# Patient Record
Sex: Female | Born: 1970 | Race: Black or African American | Hispanic: No | Marital: Single | Smoking: Never smoker
Health system: Southern US, Community
[De-identification: ages and names within clinical notes are randomized; demographics above are authoritative.]

## PROBLEM LIST (undated history)

## (undated) DIAGNOSIS — D869 Sarcoidosis, unspecified: Secondary | ICD-10-CM

## (undated) DIAGNOSIS — E785 Hyperlipidemia, unspecified: Secondary | ICD-10-CM

## (undated) DIAGNOSIS — I10 Essential (primary) hypertension: Secondary | ICD-10-CM

## (undated) DIAGNOSIS — D259 Leiomyoma of uterus, unspecified: Secondary | ICD-10-CM

## (undated) HISTORY — PX: OTHER SURGICAL HISTORY: SHX169

## (undated) HISTORY — DX: Essential (primary) hypertension: I10

## (undated) HISTORY — DX: Leiomyoma of uterus, unspecified: D25.9

---

## 2005-06-15 ENCOUNTER — Ambulatory Visit: Payer: Self-pay | Admitting: Family Medicine

## 2010-02-11 ENCOUNTER — Ambulatory Visit: Payer: Self-pay | Admitting: Obstetrics and Gynecology

## 2011-02-17 ENCOUNTER — Ambulatory Visit: Payer: Self-pay | Admitting: Obstetrics and Gynecology

## 2012-02-18 ENCOUNTER — Ambulatory Visit: Payer: Self-pay | Admitting: Obstetrics and Gynecology

## 2012-04-15 ENCOUNTER — Ambulatory Visit: Payer: Self-pay | Admitting: Family Medicine

## 2013-02-28 ENCOUNTER — Ambulatory Visit: Payer: Self-pay | Admitting: Obstetrics and Gynecology

## 2013-12-19 DIAGNOSIS — R87610 Atypical squamous cells of undetermined significance on cytologic smear of cervix (ASC-US): Secondary | ICD-10-CM | POA: Insufficient documentation

## 2013-12-19 LAB — HM PAP SMEAR: HM Pap smear: POSITIVE

## 2014-01-25 LAB — TSH: TSH: 0.87 u[IU]/mL (ref <=5.90)

## 2014-04-05 ENCOUNTER — Ambulatory Visit: Payer: Self-pay | Admitting: Obstetrics and Gynecology

## 2014-04-17 ENCOUNTER — Ambulatory Visit: Payer: Self-pay | Admitting: Obstetrics and Gynecology

## 2014-08-01 LAB — BASIC METABOLIC PANEL
BUN: 13 mg/dL (ref 4–21)
CREATININE: 0.9 mg/dL (ref <=1.1)
GLUCOSE: 99 mg/dL

## 2014-08-01 LAB — LIPID PANEL
Cholesterol: 270 mg/dL — AB (ref 0–200)
HDL: 79 mg/dL — AB (ref 35–70)
LDL CALC: 172 mg/dL
Triglycerides: 96 mg/dL (ref 40–160)

## 2014-11-06 DIAGNOSIS — E785 Hyperlipidemia, unspecified: Secondary | ICD-10-CM | POA: Insufficient documentation

## 2014-11-06 DIAGNOSIS — Z1331 Encounter for screening for depression: Secondary | ICD-10-CM | POA: Insufficient documentation

## 2014-11-06 DIAGNOSIS — I10 Essential (primary) hypertension: Secondary | ICD-10-CM | POA: Insufficient documentation

## 2014-11-06 DIAGNOSIS — Z9114 Patient's other noncompliance with medication regimen: Secondary | ICD-10-CM | POA: Insufficient documentation

## 2015-01-03 ENCOUNTER — Ambulatory Visit (INDEPENDENT_AMBULATORY_CARE_PROVIDER_SITE_OTHER): Payer: BC Managed Care – PPO | Admitting: Family Medicine

## 2015-01-03 ENCOUNTER — Encounter: Payer: Self-pay | Admitting: Family Medicine

## 2015-01-03 ENCOUNTER — Other Ambulatory Visit: Payer: Self-pay | Admitting: Obstetrics and Gynecology

## 2015-01-03 VITALS — BP 120/70 | HR 62 | Ht 62.0 in | Wt 154.0 lb

## 2015-01-03 DIAGNOSIS — I10 Essential (primary) hypertension: Secondary | ICD-10-CM | POA: Diagnosis not present

## 2015-01-03 DIAGNOSIS — E785 Hyperlipidemia, unspecified: Secondary | ICD-10-CM

## 2015-01-03 DIAGNOSIS — N921 Excessive and frequent menstruation with irregular cycle: Secondary | ICD-10-CM | POA: Diagnosis not present

## 2015-01-03 DIAGNOSIS — Z1231 Encounter for screening mammogram for malignant neoplasm of breast: Secondary | ICD-10-CM

## 2015-01-03 MED ORDER — HYDROCHLOROTHIAZIDE 12.5 MG PO TABS: 12.5000 mg | ORAL_TABLET | Freq: Every day | ORAL | Status: DC

## 2015-01-03 MED ORDER — AMLODIPINE BESYLATE 5 MG PO TABS: 5.0000 mg | ORAL_TABLET | Freq: Every day | ORAL | Status: DC

## 2015-01-03 NOTE — Progress Notes (Signed)
Name: Sarah Mcgrath   MRN: [id number]    DOB: [birth year]   Date:01/03/2015       Progress Note  Subjective  Chief Complaint  Chief Complaint  Patient presents with  . Hypertension    B/P recheck- needs lipid, TSH, anemia, renal    Hypertension This is a chronic problem. The current episode started more than 1 year ago. The problem has been gradually improving since onset. The problem is controlled. Associated symptoms include malaise/fatigue. Pertinent negatives include no anxiety, blurred vision, chest pain, headaches, neck pain, orthopnea, palpitations, peripheral edema, PND, shortness of breath or sweats. There are no known risk factors for coronary artery disease. The current treatment provides moderate improvement. There are no compliance problems.  There is no history of angina, kidney disease, CAD/MI, CVA, heart failure, left ventricular hypertrophy, PVD or retinopathy. There is no history of chronic renal disease.    No problem-specific assessment & plan notes found for this encounter.   Past Medical History  Diagnosis Date  . Fibroid uterus     Past Surgical History  Procedure Laterality Date  . Fibroid      History reviewed. No pertinent family history.  History   Social History  . Marital Status: Single    Spouse Name: N/A  . Number of Children: N/A  . Years of Education: N/A   Occupational History  . Not on file.   Social History Main Topics  . Smoking status: Never Smoker   . Smokeless tobacco: Not on file  . Alcohol Use: 0.0 oz/week    0 Standard drinks or equivalent per week  . Drug Use: No  . Sexual Activity: Yes   Other Topics Concern  . Not on file   Social History Narrative    No Known Allergies   Review of Systems  Constitutional: Positive for malaise/fatigue.  HENT: Negative for nosebleeds and sore throat.   Eyes: Negative for blurred vision.  Respiratory: Negative for shortness of breath.   Cardiovascular: Negative for  chest pain, palpitations, orthopnea and PND.  Gastrointestinal: Negative for heartburn, nausea and vomiting.  Musculoskeletal: Negative for neck pain.  Skin: Negative for itching and rash.  Neurological: Negative for dizziness, tingling, tremors, loss of consciousness and headaches.  Endo/Heme/Allergies: Negative for environmental allergies and polydipsia. Does not bruise/bleed easily.  Psychiatric/Behavioral: Negative for depression. The patient is not nervous/anxious.      Objective  Filed Vitals:   01/03/15 0811  BP: 120/70  Pulse: 62  Height: 5\' 2"  (1.575 m)  Weight: 154 lb (69.854 kg)    Physical Exam  Constitutional: She is well-developed, well-nourished, and in no distress. No distress.  HENT:  Head: Normocephalic and atraumatic.  Right Ear: External ear normal.  Left Ear: External ear normal.  Nose: Nose normal.  Mouth/Throat: Oropharynx is clear and moist.  Eyes: Conjunctivae and EOM are normal. Pupils are equal, round, and reactive to light. Right eye exhibits no discharge. Left eye exhibits no discharge.  Neck: Normal range of motion. Neck supple. No JVD present. No thyromegaly present.  Cardiovascular: Normal rate, regular rhythm, normal heart sounds and intact distal pulses.  Exam reveals no gallop and no friction rub.   No murmur heard. Pulmonary/Chest: Effort normal and breath sounds normal. No respiratory distress. She has no wheezes.  Abdominal: Soft. Bowel sounds are normal. She exhibits no mass. There is no tenderness. There is no guarding.  Musculoskeletal: Normal range of motion. She exhibits no edema.  Lymphadenopathy:    She  has no cervical adenopathy.  Neurological: She is alert. She has normal reflexes.  Skin: Skin is warm and dry. She is not diaphoretic.  Psychiatric: Mood and affect normal.      Assessment & Plan  Problem List Items Addressed This Visit      Cardiovascular and Mediastinum   Essential (primary) hypertension - Primary    Relevant Medications   amLODipine (NORVASC) 5 MG tablet   hydrochlorothiazide (HYDRODIURIL) 12.5 MG tablet   Other Relevant Orders   Renal Function Panel     Other   HLD (hyperlipidemia)   Relevant Medications   amLODipine (NORVASC) 5 MG tablet   hydrochlorothiazide (HYDRODIURIL) 12.5 MG tablet   Other Relevant Orders   Lipid Profile    Other Visit Diagnoses    Menorrhagia with irregular cycle        Relevant Orders    TSH    CBC w/Diff         Dr. Yarden Hillis [facility]  01/03/2015

## 2015-01-04 LAB — RENAL FUNCTION PANEL
Albumin: 4.4 g/dL (ref 3.5–5.5)
BUN/Creatinine Ratio: 20 (ref 9–23)
BUN: 17 mg/dL (ref 6–24)
CALCIUM: 10.2 mg/dL (ref 8.7–10.2)
CHLORIDE: 99 mmol/L (ref 97–108)
CO2: 25 mmol/L (ref 18–29)
CREATININE: 0.83 mg/dL (ref 0.57–1.00)
GFR calc Af Amer: 99 mL/min/{1.73_m2} (ref >59)
GFR calc non Af Amer: 86 mL/min/{1.73_m2} (ref >59)
GLUCOSE: 90 mg/dL (ref 65–99)
Phosphorus: 3.8 mg/dL (ref 2.5–4.5)
Potassium: 3.9 mmol/L (ref 3.5–5.2)
Sodium: 140 mmol/L (ref 134–144)

## 2015-01-04 LAB — CBC WITH DIFFERENTIAL/PLATELET
BASOS: 1 %
Basophils Absolute: 0 10*3/uL (ref 0.0–0.2)
EOS (ABSOLUTE): 0.1 10*3/uL (ref 0.0–0.4)
Eos: 1 %
HEMATOCRIT: 38.6 % (ref 34.0–46.6)
HEMOGLOBIN: 12.3 g/dL (ref 11.1–15.9)
IMMATURE GRANS (ABS): 0 10*3/uL (ref 0.0–0.1)
IMMATURE GRANULOCYTES: 0 %
LYMPHS ABS: 1.1 10*3/uL (ref 0.7–3.1)
LYMPHS: 19 %
MCH: 27 pg (ref 26.6–33.0)
MCHC: 31.9 g/dL (ref 31.5–35.7)
MCV: 85 fL (ref 79–97)
Monocytes Absolute: 0.7 10*3/uL (ref 0.1–0.9)
Monocytes: 12 %
NEUTROS ABS: 3.8 10*3/uL (ref 1.4–7.0)
Neutrophils: 67 %
Platelets: 312 10*3/uL (ref 150–379)
RBC: 4.56 x10E6/uL (ref 3.77–5.28)
RDW: 13.6 % (ref 12.3–15.4)
WBC: 5.7 10*3/uL (ref 3.4–10.8)

## 2015-01-04 LAB — LIPID PANEL
CHOL/HDL RATIO: 3.3 ratio (ref 0.0–4.4)
Cholesterol, Total: 265 mg/dL — ABNORMAL HIGH (ref 100–199)
HDL: 80 mg/dL (ref >39)
LDL CALC: 168 mg/dL — AB (ref 0–99)
Triglycerides: 84 mg/dL (ref 0–149)
VLDL CHOLESTEROL CAL: 17 mg/dL (ref 5–40)

## 2015-01-04 LAB — TSH: TSH: 1.31 u[IU]/mL (ref 0.450–4.500)

## 2015-04-08 ENCOUNTER — Ambulatory Visit
Admission: RE | Admit: 2015-04-08 | Discharge: 2015-04-08 | Disposition: A | Discharge status: Home / Self Care | Payer: BC Managed Care – PPO | Source: Ambulatory Visit | Attending: Obstetrics and Gynecology | Admitting: Obstetrics and Gynecology

## 2015-04-08 DIAGNOSIS — Z1231 Encounter for screening mammogram for malignant neoplasm of breast: Secondary | ICD-10-CM | POA: Diagnosis present

## 2015-09-21 ENCOUNTER — Other Ambulatory Visit: Payer: Self-pay | Admitting: Family Medicine

## 2015-10-20 ENCOUNTER — Other Ambulatory Visit: Payer: Self-pay | Admitting: Family Medicine

## 2015-11-11 ENCOUNTER — Other Ambulatory Visit: Payer: Self-pay | Admitting: Family Medicine

## 2015-11-13 ENCOUNTER — Other Ambulatory Visit: Payer: Self-pay | Admitting: Dermatology

## 2015-11-13 ENCOUNTER — Ambulatory Visit
Admission: RE | Admit: 2015-11-13 | Discharge: 2015-11-13 | Disposition: A | Discharge status: Home / Self Care | Payer: BC Managed Care – PPO | Source: Ambulatory Visit | Attending: Dermatology | Admitting: Dermatology

## 2015-11-13 DIAGNOSIS — D863 Sarcoidosis of skin: Secondary | ICD-10-CM

## 2015-11-13 DIAGNOSIS — L309 Dermatitis, unspecified: Secondary | ICD-10-CM | POA: Diagnosis not present

## 2015-11-20 ENCOUNTER — Other Ambulatory Visit: Payer: Self-pay | Admitting: Family Medicine

## 2015-12-16 ENCOUNTER — Other Ambulatory Visit: Payer: Self-pay | Admitting: Family Medicine

## 2016-03-03 ENCOUNTER — Other Ambulatory Visit: Payer: Self-pay

## 2016-03-03 MED ORDER — AMLODIPINE BESYLATE 5 MG PO TABS: 5.0000 mg | ORAL_TABLET | Freq: Every day | ORAL | 0 refills | Status: DC

## 2016-03-03 MED ORDER — HYDROCHLOROTHIAZIDE 12.5 MG PO CAPS: 12.5000 mg | ORAL_CAPSULE | Freq: Every day | ORAL | 0 refills | Status: DC

## 2016-03-11 ENCOUNTER — Other Ambulatory Visit: Payer: Self-pay | Admitting: Obstetrics and Gynecology

## 2016-03-11 DIAGNOSIS — Z1231 Encounter for screening mammogram for malignant neoplasm of breast: Secondary | ICD-10-CM

## 2016-03-17 ENCOUNTER — Encounter: Payer: Self-pay | Admitting: Family Medicine

## 2016-03-17 ENCOUNTER — Ambulatory Visit (INDEPENDENT_AMBULATORY_CARE_PROVIDER_SITE_OTHER): Payer: BC Managed Care – PPO | Admitting: Family Medicine

## 2016-03-17 VITALS — BP 138/98 | HR 80 | Ht 62.0 in | Wt 151.0 lb

## 2016-03-17 DIAGNOSIS — I10 Essential (primary) hypertension: Secondary | ICD-10-CM

## 2016-03-17 MED ORDER — HYDROCHLOROTHIAZIDE 25 MG PO TABS: 25.0000 mg | ORAL_TABLET | Freq: Every day | ORAL | 1 refills | Status: DC

## 2016-03-17 MED ORDER — AMLODIPINE BESYLATE 5 MG PO TABS: 5.0000 mg | ORAL_TABLET | Freq: Every day | ORAL | 1 refills | Status: DC

## 2016-03-17 NOTE — Progress Notes (Signed)
Name: Sarah Mcgrath   MRN: [id number]    DOB: [birth year]   Date:03/17/2016       Progress Note  Subjective  Chief Complaint  Chief Complaint  Patient presents with  . Hypertension    Hypertension  This is a chronic problem. The current episode started more than 1 year ago. The problem has been gradually improving since onset. The problem is controlled. Pertinent negatives include no anxiety, blurred vision, chest pain, headaches, malaise/fatigue, neck pain, orthopnea, palpitations, peripheral edema, PND, shortness of breath or sweats. There are no associated agents to hypertension. There are no known risk factors for coronary artery disease. Past treatments include diuretics and calcium channel blockers. The current treatment provides no improvement. There are no compliance problems.  There is no history of angina, kidney disease, CAD/MI, CVA, heart failure, left ventricular hypertrophy, PVD, renovascular disease or retinopathy. There is no history of chronic renal disease or a hypertension causing med.    No problem-specific Assessment & Plan notes found for this encounter.   Past Medical History:  Diagnosis Date  . Fibroid uterus   . Hypertension     Past Surgical History:  Procedure Laterality Date  . fibroid      Family History  Problem Relation Age of Onset  . Diabetes Mother   . Heart disease Mother   . Hypertension Father   . Stroke Maternal Grandmother   . Stroke Maternal Grandfather     Social History   Social History  . Marital status: Single    Spouse name: N/A  . Number of children: N/A  . Years of education: N/A   Occupational History  . Not on file.   Social History Main Topics  . Smoking status: Never Smoker  . Smokeless tobacco: Not on file  . Alcohol use 0.0 oz/week  . Drug use: No  . Sexual activity: Yes   Other Topics Concern  . Not on file   Social History Narrative  . No narrative on file    No Known Allergies   Review of  Systems  Constitutional: Negative for chills, fever, malaise/fatigue and weight loss.  HENT: Negative for ear discharge, ear pain and sore throat.   Eyes: Negative for blurred vision.  Respiratory: Negative for cough, sputum production, shortness of breath and wheezing.   Cardiovascular: Negative for chest pain, palpitations, orthopnea, leg swelling and PND.  Gastrointestinal: Negative for abdominal pain, blood in stool, constipation, diarrhea, heartburn, melena and nausea.  Genitourinary: Negative for dysuria, frequency, hematuria and urgency.  Musculoskeletal: Negative for back pain, joint pain, myalgias and neck pain.  Skin: Negative for rash.  Neurological: Negative for dizziness, tingling, sensory change, focal weakness and headaches.  Endo/Heme/Allergies: Negative for environmental allergies and polydipsia. Does not bruise/bleed easily.  Psychiatric/Behavioral: Negative for depression and suicidal ideas. The patient is not nervous/anxious and does not have insomnia.      Objective  Vitals:   03/17/16 0802  BP: (!) 138/98  Pulse: 80  Weight: 151 lb (68.5 kg)  Height: 5\' 2"  (1.575 m)    Physical Exam  Constitutional: She is well-developed, well-nourished, and in no distress. No distress.  HENT:  Head: Normocephalic and atraumatic.  Right Ear: Tympanic membrane, external ear and ear canal normal.  Left Ear: Tympanic membrane, external ear and ear canal normal.  Nose: Nose normal.  Mouth/Throat: Oropharynx is clear and moist.  Eyes: Conjunctivae and EOM are normal. Pupils are equal, round, and reactive to light. Right eye exhibits no discharge.  Left eye exhibits no discharge.  Neck: Normal range of motion. Neck supple. No JVD present. No thyromegaly present.  Cardiovascular: Normal rate, regular rhythm, normal heart sounds and intact distal pulses.  Exam reveals no gallop and no friction rub.   No murmur heard. Pulmonary/Chest: Effort normal and breath sounds normal. She has  no wheezes. She has no rales.  Abdominal: Soft. Bowel sounds are normal. She exhibits no mass. There is no tenderness. There is no guarding.  Musculoskeletal: Normal range of motion. She exhibits no edema.  Lymphadenopathy:    She has no cervical adenopathy.  Neurological: She is alert.  Skin: Skin is warm and dry. She is not diaphoretic.  Psychiatric: Mood and affect normal.  Nursing note and vitals reviewed.     Assessment & Plan  Problem List Items Addressed This Visit      Cardiovascular and Mediastinum   Essential (primary) hypertension   Relevant Medications   amLODipine (NORVASC) 5 MG tablet   hydrochlorothiazide (HYDRODIURIL) 25 MG tablet    Other Visit Diagnoses    Essential hypertension    -  Primary   Relevant Medications   amLODipine (NORVASC) 5 MG tablet   hydrochlorothiazide (HYDRODIURIL) 25 MG tablet        Dr. Deanna Jones [facility]  03/17/16

## 2016-04-09 ENCOUNTER — Ambulatory Visit
Admission: RE | Admit: 2016-04-09 | Discharge: 2016-04-09 | Disposition: A | Discharge status: Home / Self Care | Payer: BC Managed Care – PPO | Source: Ambulatory Visit | Attending: Obstetrics and Gynecology | Admitting: Obstetrics and Gynecology

## 2016-04-09 DIAGNOSIS — Z1231 Encounter for screening mammogram for malignant neoplasm of breast: Secondary | ICD-10-CM | POA: Insufficient documentation

## 2016-04-28 ENCOUNTER — Ambulatory Visit (INDEPENDENT_AMBULATORY_CARE_PROVIDER_SITE_OTHER): Payer: BC Managed Care – PPO | Admitting: Family Medicine

## 2016-04-28 ENCOUNTER — Encounter: Payer: Self-pay | Admitting: Family Medicine

## 2016-04-28 DIAGNOSIS — I10 Essential (primary) hypertension: Secondary | ICD-10-CM | POA: Diagnosis not present

## 2016-04-28 MED ORDER — AMLODIPINE BESYLATE 5 MG PO TABS: 5.0000 mg | ORAL_TABLET | Freq: Every day | ORAL | 1 refills | Status: DC

## 2016-04-28 MED ORDER — HYDROCHLOROTHIAZIDE 25 MG PO TABS: 25.0000 mg | ORAL_TABLET | Freq: Every day | ORAL | 1 refills | Status: DC

## 2016-04-28 NOTE — Progress Notes (Signed)
Name: Sarah Mcgrath   MRN: [id number]    DOB: 12-30-1970   Date:04/28/2016       Progress Note  Subjective  Chief Complaint  Chief Complaint  Patient presents with  . Hypertension    Hypertension  This is a chronic problem. The current episode started more than 1 year ago. The problem has been gradually improving since onset. The problem is controlled. Pertinent negatives include no anxiety, blurred vision, chest pain, headaches, malaise/fatigue, neck pain, orthopnea, palpitations, peripheral edema, PND, shortness of breath or sweats. There are no associated agents to hypertension. There are no known risk factors for coronary artery disease. Past treatments include calcium channel blockers and diuretics. The current treatment provides moderate improvement. There are no compliance problems.  There is no history of angina, kidney disease, CAD/MI, CVA, heart failure, left ventricular hypertrophy, PVD, renovascular disease or retinopathy. There is no history of chronic renal disease or a hypertension causing med.    No problem-specific Assessment & Plan notes found for this encounter.   Past Medical History:  Diagnosis Date  . Fibroid uterus   . Hypertension     Past Surgical History:  Procedure Laterality Date  . fibroid      Family History  Problem Relation Age of Onset  . Diabetes Mother   . Heart disease Mother   . Hypertension Father   . Stroke Maternal Grandmother   . Stroke Maternal Grandfather     Social History   Social History  . Marital status: Single    Spouse name: N/A  . Number of children: N/A  . Years of education: N/A   Occupational History  . Not on file.   Social History Main Topics  . Smoking status: Never Smoker  . Smokeless tobacco: Not on file  . Alcohol use 0.0 oz/week  . Drug use: No  . Sexual activity: Yes   Other Topics Concern  . Not on file   Social History Narrative  . No narrative on file    No Known  Allergies   Review of Systems  Constitutional: Negative for chills, fever, malaise/fatigue and weight loss.  HENT: Negative for ear discharge, ear pain and sore throat.   Eyes: Negative for blurred vision.  Respiratory: Negative for cough, sputum production, shortness of breath and wheezing.   Cardiovascular: Negative for chest pain, palpitations, orthopnea, leg swelling and PND.  Gastrointestinal: Negative for abdominal pain, blood in stool, constipation, diarrhea, heartburn, melena and nausea.  Genitourinary: Negative for dysuria, frequency, hematuria and urgency.  Musculoskeletal: Negative for back pain, joint pain, myalgias and neck pain.  Skin: Negative for rash.  Neurological: Negative for dizziness, tingling, sensory change, focal weakness and headaches.  Endo/Heme/Allergies: Negative for environmental allergies and polydipsia. Does not bruise/bleed easily.  Psychiatric/Behavioral: Negative for depression and suicidal ideas. The patient is not nervous/anxious and does not have insomnia.      Objective  Vitals:   04/28/16 0807  BP: 128/80  Pulse: 68  Weight: 154 lb (69.9 kg)  Height: 5\' 2"  (1.575 m)    Physical Exam  Constitutional: She is well-developed, well-nourished, and in no distress. No distress.  HENT:  Head: Normocephalic and atraumatic.  Right Ear: External ear normal.  Left Ear: External ear normal.  Nose: Nose normal.  Mouth/Throat: Oropharynx is clear and moist.  Eyes: Conjunctivae and EOM are normal. Pupils are equal, round, and reactive to light. Right eye exhibits no discharge. Left eye exhibits no discharge.  Neck: Normal range of motion.  Neck supple. No JVD present. No thyromegaly present.  Cardiovascular: Normal rate, regular rhythm, normal heart sounds and intact distal pulses.  Exam reveals no gallop and no friction rub.   No murmur heard. Pulmonary/Chest: Effort normal and breath sounds normal. She has no wheezes. She has no rales.  Abdominal:  Soft. Bowel sounds are normal. She exhibits no mass. There is no tenderness. There is no guarding.  Musculoskeletal: Normal range of motion. She exhibits no edema.  Lymphadenopathy:    She has no cervical adenopathy.  Neurological: She is alert. She has normal reflexes.  Skin: Skin is warm and dry. She is not diaphoretic.  Psychiatric: Mood and affect normal.  Nursing note and vitals reviewed.     Assessment & Plan  Problem List Items Addressed This Visit      Cardiovascular and Mediastinum   Essential hypertension   Relevant Medications   amLODipine (NORVASC) 5 MG tablet   hydrochlorothiazide (HYDRODIURIL) 25 MG tablet   Other Relevant Orders   Renal Function Panel    Other Visit Diagnoses   None.    I spent 15 minutes with this patient, More than 50% of that time was spent in face to face education, counseling and care coordination.   Dr. [facility]  04/28/16

## 2016-04-29 LAB — RENAL FUNCTION PANEL
Albumin: 4.4 g/dL (ref 3.5–5.5)
BUN / CREAT RATIO: 20 (ref 9–23)
BUN: 16 mg/dL (ref 6–24)
CO2: 25 mmol/L (ref 18–29)
CREATININE: 0.8 mg/dL (ref 0.57–1.00)
Calcium: 9.6 mg/dL (ref 8.7–10.2)
Chloride: 97 mmol/L (ref 96–106)
GFR, EST AFRICAN AMERICAN: 102 mL/min/{1.73_m2} (ref >59)
GFR, EST NON AFRICAN AMERICAN: 89 mL/min/{1.73_m2} (ref >59)
GLUCOSE: 80 mg/dL (ref 65–99)
PHOSPHORUS: 3 mg/dL (ref 2.5–4.5)
POTASSIUM: 3.8 mmol/L (ref 3.5–5.2)
SODIUM: 138 mmol/L (ref 134–144)

## 2016-05-07 ENCOUNTER — Other Ambulatory Visit: Payer: Self-pay

## 2016-10-26 ENCOUNTER — Ambulatory Visit: Payer: BC Managed Care – PPO | Admitting: Family Medicine

## 2016-10-29 ENCOUNTER — Encounter: Payer: Self-pay | Admitting: Family Medicine

## 2016-10-29 ENCOUNTER — Ambulatory Visit (INDEPENDENT_AMBULATORY_CARE_PROVIDER_SITE_OTHER): Payer: BC Managed Care – PPO | Admitting: Family Medicine

## 2016-10-29 VITALS — BP 120/70 | HR 68 | Ht 62.0 in | Wt 149.0 lb

## 2016-10-29 DIAGNOSIS — Z23 Encounter for immunization: Secondary | ICD-10-CM | POA: Diagnosis not present

## 2016-10-29 DIAGNOSIS — I10 Essential (primary) hypertension: Secondary | ICD-10-CM | POA: Diagnosis not present

## 2016-10-29 MED ORDER — AMLODIPINE BESYLATE 5 MG PO TABS: 5.0000 mg | ORAL_TABLET | Freq: Every day | ORAL | 3 refills | Status: DC

## 2016-10-29 MED ORDER — HYDROCHLOROTHIAZIDE 25 MG PO TABS: 25.0000 mg | ORAL_TABLET | Freq: Every day | ORAL | 3 refills | Status: DC

## 2016-10-29 NOTE — Progress Notes (Signed)
Name: Sarah Mcgrath   MRN: [id number]    DOB: 09-25-1970   Date:10/29/2016       Progress Note  Subjective  Chief Complaint  Chief Complaint  Patient presents with  . Hypertension    Hypertension  This is a chronic problem. The current episode started more than 1 year ago. The problem has been gradually improving since onset. The problem is controlled. Pertinent negatives include no anxiety, blurred vision, chest pain, headaches, malaise/fatigue, neck pain, orthopnea, palpitations, peripheral edema, PND, shortness of breath or sweats. There are no associated agents to hypertension. Risk factors for coronary artery disease include dyslipidemia and diabetes mellitus. Past treatments include calcium channel blockers and diuretics. The current treatment provides moderate improvement. There are no compliance problems.  There is no history of angina, kidney disease, CAD/MI, CVA, heart failure, left ventricular hypertrophy, PVD or retinopathy. There is no history of chronic renal disease, a hypertension causing med or renovascular disease.    No problem-specific Assessment & Plan notes found for this encounter.   Past Medical History:  Diagnosis Date  . Fibroid uterus   . Hypertension     Past Surgical History:  Procedure Laterality Date  . fibroid      Family History  Problem Relation Age of Onset  . Diabetes Mother   . Heart disease Mother   . Hypertension Father   . Stroke Maternal Grandmother   . Stroke Maternal Grandfather     Social History   Social History  . Marital status: Single    Spouse name: N/A  . Number of children: N/A  . Years of education: N/A   Occupational History  . Not on file.   Social History Main Topics  . Smoking status: Never Smoker  . Smokeless tobacco: Not on file  . Alcohol use 0.0 oz/week  . Drug use: No  . Sexual activity: Yes   Other Topics Concern  . Not on file   Social History Narrative  . No narrative on file    No  Known Allergies  Outpatient Medications Prior to Visit  Medication Sig Dispense Refill  . amLODipine (NORVASC) 5 MG tablet Take 1 tablet (5 mg total) by mouth daily. 90 tablet 1  . hydrochlorothiazide (HYDRODIURIL) 25 MG tablet Take 1 tablet (25 mg total) by mouth daily. 90 tablet 1  . fluocinonide cream (LIDEX) 5.99 % Apply 1 application topically daily.     No facility-administered medications prior to visit.     Review of Systems  Constitutional: Negative for chills, fever, malaise/fatigue and weight loss.  HENT: Negative for ear discharge, ear pain and sore throat.   Eyes: Negative for blurred vision.  Respiratory: Negative for cough, sputum production, shortness of breath and wheezing.   Cardiovascular: Negative for chest pain, palpitations, orthopnea, leg swelling and PND.  Gastrointestinal: Negative for abdominal pain, blood in stool, constipation, diarrhea, heartburn, melena and nausea.  Genitourinary: Negative for dysuria, frequency, hematuria and urgency.  Musculoskeletal: Negative for back pain, joint pain, myalgias and neck pain.  Skin: Negative for rash.  Neurological: Negative for dizziness, tingling, sensory change, focal weakness and headaches.  Endo/Heme/Allergies: Negative for environmental allergies and polydipsia. Does not bruise/bleed easily.  Psychiatric/Behavioral: Negative for depression and suicidal ideas. The patient is not nervous/anxious and does not have insomnia.      Objective  Vitals:   10/29/16 0810  BP: 120/70  Pulse: 68  Weight: 149 lb (67.6 kg)  Height: 5\' 2"  (1.575 m)    Physical  Exam  Constitutional: She is well-developed, well-nourished, and in no distress. No distress.  HENT:  Head: Normocephalic and atraumatic.  Right Ear: External ear normal.  Left Ear: External ear normal.  Nose: Nose normal.  Mouth/Throat: Oropharynx is clear and moist.  Eyes: Conjunctivae and EOM are normal. Pupils are equal, round, and reactive to light. Right  eye exhibits no discharge. Left eye exhibits no discharge.  Neck: Normal range of motion. Neck supple. No JVD present. No thyromegaly present.  Cardiovascular: Normal rate, regular rhythm, normal heart sounds and intact distal pulses.  Exam reveals no gallop and no friction rub.   No murmur heard. Pulmonary/Chest: Effort normal and breath sounds normal. She has no wheezes. She has no rales.  Abdominal: Soft. Bowel sounds are normal. She exhibits no mass. There is no tenderness. There is no rebound and no guarding.  Musculoskeletal: Normal range of motion. She exhibits no edema.  Lymphadenopathy:    She has no cervical adenopathy.  Neurological: She is alert. She has normal reflexes.  Skin: Skin is warm and dry. She is not diaphoretic.  keloid  Psychiatric: Mood and affect normal.      Assessment & Plan  Problem List Items Addressed This Visit      Cardiovascular and Mediastinum   Essential hypertension - Primary   Relevant Medications   hydrochlorothiazide (HYDRODIURIL) 25 MG tablet   amLODipine (NORVASC) 5 MG tablet    Other Visit Diagnoses    Need for diphtheria-tetanus-pertussis (Tdap) vaccine       Relevant Orders   Tdap vaccine greater than or equal to 7yo IM (Completed)      Meds ordered this encounter  Medications  . hydrochlorothiazide (HYDRODIURIL) 25 MG tablet    Sig: Take 1 tablet (25 mg total) by mouth daily.    Dispense:  90 tablet    Refill:  3  . amLODipine (NORVASC) 5 MG tablet    Sig: Take 1 tablet (5 mg total) by mouth daily.    Dispense:  90 tablet    Refill:  3    Last time filling- sched appt      Dr. Otilio Miu [facility]  10/29/16

## 2017-03-03 ENCOUNTER — Other Ambulatory Visit: Payer: Self-pay | Admitting: Family Medicine

## 2017-03-03 DIAGNOSIS — Z1231 Encounter for screening mammogram for malignant neoplasm of breast: Secondary | ICD-10-CM

## 2017-03-04 ENCOUNTER — Ambulatory Visit
Admission: RE | Admit: 2017-03-04 | Discharge: 2017-03-04 | Disposition: A | Discharge status: Home / Self Care | Payer: BC Managed Care – PPO | Source: Ambulatory Visit | Attending: Family Medicine | Admitting: Family Medicine

## 2017-03-04 ENCOUNTER — Encounter: Payer: Self-pay | Admitting: Family Medicine

## 2017-03-04 ENCOUNTER — Ambulatory Visit (INDEPENDENT_AMBULATORY_CARE_PROVIDER_SITE_OTHER): Payer: BC Managed Care – PPO | Admitting: Family Medicine

## 2017-03-04 VITALS — BP 120/78 | HR 80 | Ht 62.0 in | Wt 150.0 lb

## 2017-03-04 DIAGNOSIS — D869 Sarcoidosis, unspecified: Secondary | ICD-10-CM

## 2017-03-04 NOTE — Patient Instructions (Signed)
Sarcoidosis Sarcoidosis is a disease that causes inflammation in your organs and other areas of your body. The lungs are most often affected (pulmonary sarcoidosis). Sarcoidosis can also affect your lymph nodes, liver, eyes, skin, or any other body tissue. When you have sarcoidosis, small clumps of tissue (granulomas) form in the affected area of your body. Granulomas are made up of your body's defense (immune) cells. Inflammation results when your body reacts to a harmful substance. Normally, inflammation goes away after immune cells get rid of the harmful substance. In sarcoidosis, the immune cells form granulomas instead. What are the causes? The exact cause of sarcoidosis is not known. Something triggers the immune system to respond, such as dust, chemicals, bacteria, or a virus. What increases the risk? You may be at a greater risk for sarcoidosis if you:  Have a family history of the disease.  Are African American.  Are of Northern European ancestry.  Are 20-50 years old.  Are female.  What are the signs or symptoms? Many people with sarcoidosis have no symptoms. Others have very mild symptoms. Sarcoidosis most often affects the lungs. Symptoms include:  Chest pain.  Coughing.  Wheezing.  Shortness of breath.  Other common symptoms include:  Night sweats.  Weight loss.  Fatigue.  Depression.  A sense of uneasiness.  How is this diagnosed? Sarcoidosis may be diagnosed by:  Medical history and physical exam.  Chest X-ray. This looks for granulomas in your lungs.  Lung function tests. These measure your breathing and look for problems related to sarcoidosis.  Examining a sample of tissue under a microscope (biopsy).  How is this treated? Sarcoidosis usually clears up without treatment. You may take medicines to reduce inflammation or relieve symptoms. These may include:  Prednisone. This steroid reduces inflammation related to sarcoidosis.  Chloroquine or  hydroxychloroquine. These are antimalarial medicines used to treat sarcoidosis that affects the skin or brain.  Methotrexate, leflunomide, or azathioprine. These medicines affect the immune system and can help with sarcoidosis in the joints, eyes, skin, or lungs.  Inhalers. Inhaled medicines can help you breathe if sarcoidosis is affecting your lungs.  Follow these instructions at home:  Do not use any tobacco products, including cigarettes, chewing tobacco, or electronic cigarettes. If you need help quitting, ask your health care provider.  Avoid secondhand smoke.  Avoid irritating dust and chemicals. Stay indoors on days when air quality is poor in your area.  Take medicines only as directed by your health care provider. Contact a health care provider if:  You have vision problems.  You have shortness of breath.  You have a dry, persistent cough.  You have an irregular heartbeat.  You have pain or ache in your joints, hands, or feet.  You have an unexplained rash. Get help right away if: You have chest pain. This information is not intended to replace advice given to you by your health care provider. Make sure you discuss any questions you have with your health care provider. Document Released: 04/15/2004 Document Revised: 11/21/2015 Document Reviewed: 10/11/2013 Elsevier Interactive Patient Education  2018 [employer]  

## 2017-03-04 NOTE — Progress Notes (Signed)
Name: Sarah Mcgrath   MRN: [id number]    DOB: 1971-04-25   Date:03/04/2017       Progress Note  Subjective  Chief Complaint  Chief Complaint  Patient presents with  . 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      No problem-specific Assessment & Plan notes found for this encounter.   Past Medical History:  Diagnosis Date  . Fibroid uterus   . Hypertension     Past Surgical History:  Procedure Laterality Date  . fibroid      Family History  Problem Relation Age of Onset  . Diabetes Mother   . Heart disease Mother   . Hypertension Father   . Stroke Maternal Grandmother   . Stroke Maternal Grandfather     Social History   Social History  . Marital status: Single    Spouse name: N/A  . Number of children: N/A  . Years of education: N/A   Occupational History  . Not on file.   Social History Main Topics  . Smoking status: Never Smoker  . Smokeless tobacco: Never Used  . Alcohol use 0.0 oz/week  . Drug use: No  . Sexual activity: Yes   Other Topics Concern  . Not on file   Social History Narrative  . No narrative on file    No Known Allergies  Outpatient Medications Prior to Visit  Medication Sig Dispense Refill  . amLODipine (NORVASC) 5 MG tablet Take 1 tablet (5 mg total) by mouth daily. 90 tablet 3  . hydrochlorothiazide (HYDRODIURIL) 25 MG tablet Take 1 tablet (25 mg total) by mouth daily. 90 tablet 3   No facility-administered medications prior to visit.     Review of Systems  Constitutional: Negative for chills, fever, malaise/fatigue and weight loss.  HENT: Negative for ear discharge, ear pain and sore throat.   Eyes: Negative for blurred vision.  Respiratory: Negative for cough, sputum production, shortness of breath and wheezing.   Cardiovascular: Negative for chest pain, palpitations and leg swelling.  Gastrointestinal: Negative for abdominal pain, blood in stool,  constipation, diarrhea, heartburn, melena and nausea.  Genitourinary: Negative for dysuria, frequency, hematuria and urgency.  Musculoskeletal: Negative for back pain, joint pain, myalgias and neck pain.  Skin: Negative for rash.  Neurological: Negative for dizziness, tingling, sensory change, focal weakness and headaches.  Endo/Heme/Allergies: Negative for environmental allergies and polydipsia. Does not bruise/bleed easily.  Psychiatric/Behavioral: Negative for depression and suicidal ideas. The patient is not nervous/anxious and does not have insomnia.      Objective  Vitals:   03/04/17 1612  BP: 120/78  Pulse: 80  Weight: 150 lb (68 kg)  Height: 5\' 2"  (1.575 m)    Physical Exam  Constitutional: She is well-developed, well-nourished, and in no distress. No distress.  HENT:  Head: Normocephalic and atraumatic.  Right Ear: External ear normal.  Left Ear: External ear normal.  Nose: Nose normal.  Mouth/Throat: Oropharynx is clear and moist.  Eyes: Pupils are equal, round, and reactive to light. Conjunctivae and EOM are normal. Right eye exhibits no discharge. Left eye exhibits no discharge.  Neck: Normal range of motion. Neck supple. No JVD present. No thyromegaly present.  Cardiovascular: Normal rate, regular rhythm, normal heart sounds and intact distal pulses.  Exam reveals no gallop and no friction rub.   No murmur heard. Pulmonary/Chest: Effort normal and breath sounds normal. She has no wheezes. She has  no rales.  Abdominal: Soft. Bowel sounds are normal. She exhibits no mass. There is no tenderness. There is no guarding.  Musculoskeletal: Normal range of motion. She exhibits no edema.  Lymphadenopathy:    She has no cervical adenopathy.  Neurological: She is alert. She has normal reflexes.  Skin: Skin is warm and dry. Rash noted. Rash is maculopapular. She is not diaphoretic.  Psychiatric: Mood and affect normal.  Nursing note and vitals reviewed.     Assessment &  Plan  Problem List Items Addressed This Visit    None    Visit Diagnoses    Sarcoid    -  Primary   Relevant Orders   CBC w/Diff/Platelet   Uric acid   Hepatic function panel   DG Chest 2 View   Comprehensive Metabolic Panel (CMET)      No orders of the defined types were placed in this encounter.     Dr. Macon [facility]  03/04/17

## 2017-03-05 LAB — COMPREHENSIVE METABOLIC PANEL
A/G RATIO: 1.7 (ref 1.2–2.2)
ALT: 29 IU/L (ref 0–32)
AST: 29 IU/L (ref 0–40)
Albumin: 4.8 g/dL (ref 3.5–5.5)
Alkaline Phosphatase: 73 IU/L (ref 39–117)
BUN / CREAT RATIO: 13 (ref 9–23)
BUN: 9 mg/dL (ref 6–24)
Bilirubin Total: 0.5 mg/dL (ref 0.0–1.2)
CO2: 26 mmol/L (ref 20–29)
CREATININE: 0.72 mg/dL (ref 0.57–1.00)
Calcium: 10.2 mg/dL (ref 8.7–10.2)
Chloride: 96 mmol/L (ref 96–106)
GFR calc Af Amer: 115 mL/min/{1.73_m2} (ref >59)
GFR, EST NON AFRICAN AMERICAN: 100 mL/min/{1.73_m2} (ref >59)
GLUCOSE: 82 mg/dL (ref 65–99)
Globulin, Total: 2.9 g/dL (ref 1.5–4.5)
Potassium: 3.2 mmol/L — ABNORMAL LOW (ref 3.5–5.2)
SODIUM: 138 mmol/L (ref 134–144)
Total Protein: 7.7 g/dL (ref 6.0–8.5)

## 2017-03-05 LAB — CBC WITH DIFFERENTIAL/PLATELET
Basophils Absolute: 0 10*3/uL (ref 0.0–0.2)
Basos: 0 %
EOS (ABSOLUTE): 0.2 10*3/uL (ref 0.0–0.4)
Eos: 3 %
HEMATOCRIT: 37.6 % (ref 34.0–46.6)
Hemoglobin: 12.8 g/dL (ref 11.1–15.9)
IMMATURE GRANULOCYTES: 0 %
Immature Grans (Abs): 0 10*3/uL (ref 0.0–0.1)
LYMPHS ABS: 1.6 10*3/uL (ref 0.7–3.1)
Lymphs: 28 %
MCH: 27.2 pg (ref 26.6–33.0)
MCHC: 34 g/dL (ref 31.5–35.7)
MCV: 80 fL (ref 79–97)
MONOS ABS: 0.9 10*3/uL (ref 0.1–0.9)
Monocytes: 16 %
NEUTROS PCT: 53 %
Neutrophils Absolute: 3 10*3/uL (ref 1.4–7.0)
PLATELETS: 339 10*3/uL (ref 150–379)
RBC: 4.71 x10E6/uL (ref 3.77–5.28)
RDW: 13.7 % (ref 12.3–15.4)
WBC: 5.6 10*3/uL (ref 3.4–10.8)

## 2017-03-05 LAB — HEPATIC FUNCTION PANEL: BILIRUBIN, DIRECT: 0.1 mg/dL (ref 0.00–0.40)

## 2017-03-05 LAB — URIC ACID: Uric Acid: 6.1 mg/dL (ref 2.5–7.1)

## 2017-04-16 ENCOUNTER — Encounter: Payer: Self-pay | Admitting: Family Medicine

## 2017-04-16 ENCOUNTER — Ambulatory Visit (INDEPENDENT_AMBULATORY_CARE_PROVIDER_SITE_OTHER): Payer: BC Managed Care – PPO | Admitting: Family Medicine

## 2017-04-16 VITALS — BP 130/80 | HR 76 | Ht 62.0 in | Wt 150.0 lb

## 2017-04-16 DIAGNOSIS — E7800 Pure hypercholesterolemia, unspecified: Secondary | ICD-10-CM | POA: Diagnosis not present

## 2017-04-16 DIAGNOSIS — I10 Essential (primary) hypertension: Secondary | ICD-10-CM | POA: Diagnosis not present

## 2017-04-16 MED ORDER — AMLODIPINE BESYLATE 5 MG PO TABS: 5.0000 mg | ORAL_TABLET | Freq: Every day | ORAL | 1 refills | Status: DC

## 2017-04-16 MED ORDER — HYDROCHLOROTHIAZIDE 25 MG PO TABS: 25.0000 mg | ORAL_TABLET | Freq: Every day | ORAL | 1 refills | Status: DC

## 2017-04-16 NOTE — Progress Notes (Signed)
Name: Sarah Mcgrath   MRN: [id number]    DOB: 04-Jun-1971   Date:04/16/2017       Progress Note  Subjective  Chief Complaint  Chief Complaint  Patient presents with  . Hypertension    Hypertension  This is a chronic problem. The current episode started more than 1 year ago. The problem has been gradually improving since onset. The problem is controlled. Pertinent negatives include no anxiety, blurred vision, chest pain, headaches, malaise/fatigue, neck pain, orthopnea, palpitations, peripheral edema, PND, shortness of breath or sweats. There are no associated agents to hypertension. Past treatments include diuretics and calcium channel blockers. The current treatment provides moderate improvement. There are no compliance problems.  There is no history of angina, kidney disease, CAD/MI, CVA, heart failure, left ventricular hypertrophy, PVD or retinopathy. There is no history of chronic renal disease, a hypertension causing med or renovascular disease.    No problem-specific Assessment & Plan notes found for this encounter.   Past Medical History:  Diagnosis Date  . Fibroid uterus   . Hypertension     Past Surgical History:  Procedure Laterality Date  . fibroid      Family History  Problem Relation Age of Onset  . Diabetes Mother   . Heart disease Mother   . Hypertension Father   . Stroke Maternal Grandmother   . Stroke Maternal Grandfather     Social History   Social History  . Marital status: Single    Spouse name: N/A  . Number of children: N/A  . Years of education: N/A   Occupational History  . Not on file.   Social History Main Topics  . Smoking status: Never Smoker  . Smokeless tobacco: Never Used  . Alcohol use 0.0 oz/week  . Drug use: No  . Sexual activity: Yes   Other Topics Concern  . Not on file   Social History Narrative  . No narrative on file    No Known Allergies  Outpatient Medications Prior to Visit  Medication Sig Dispense  Refill  . amLODipine (NORVASC) 5 MG tablet Take 1 tablet (5 mg total) by mouth daily. 90 tablet 3  . hydrochlorothiazide (HYDRODIURIL) 25 MG tablet Take 1 tablet (25 mg total) by mouth daily. 90 tablet 3   No facility-administered medications prior to visit.     Review of Systems  Constitutional: Negative for chills, fever, malaise/fatigue and weight loss.  HENT: Negative for ear discharge, ear pain and sore throat.   Eyes: Negative for blurred vision.  Respiratory: Negative for cough, sputum production, shortness of breath and wheezing.   Cardiovascular: Negative for chest pain, palpitations, orthopnea, leg swelling and PND.  Gastrointestinal: Negative for abdominal pain, blood in stool, constipation, diarrhea, heartburn, melena and nausea.  Genitourinary: Negative for dysuria, frequency, hematuria and urgency.  Musculoskeletal: Negative for back pain, joint pain, myalgias and neck pain.  Skin: Negative for rash.  Neurological: Negative for dizziness, tingling, sensory change, focal weakness and headaches.  Endo/Heme/Allergies: Negative for environmental allergies and polydipsia. Does not bruise/bleed easily.  Psychiatric/Behavioral: Negative for depression and suicidal ideas. The patient is not nervous/anxious and does not have insomnia.      Objective  Vitals:   04/16/17 0817  BP: 130/80  Pulse: 76  Weight: 150 lb (68 kg)  Height: 5\' 2"  (1.575 m)    Physical Exam  Constitutional: She is well-developed, well-nourished, and in no distress. No distress.  HENT:  Head: Normocephalic and atraumatic.  Right Ear: External ear normal.  Left Ear: External ear normal.  Nose: Nose normal.  Mouth/Throat: Oropharynx is clear and moist.  Eyes: Pupils are equal, round, and reactive to light. Conjunctivae and EOM are normal. Right eye exhibits no discharge. Left eye exhibits no discharge.  Neck: Normal range of motion. Neck supple. No JVD present. No thyromegaly present.  Cardiovascular:  Normal rate, regular rhythm, normal heart sounds and intact distal pulses.  Exam reveals no gallop and no friction rub.   No murmur heard. Pulmonary/Chest: Effort normal and breath sounds normal. She has no wheezes. She has no rales.  Abdominal: Soft. Bowel sounds are normal. She exhibits no mass. There is no tenderness. There is no guarding.  Musculoskeletal: Normal range of motion. She exhibits no edema.  Lymphadenopathy:    She has no cervical adenopathy.  Neurological: She is alert. She has normal reflexes.  Skin: Skin is warm and dry. She is not diaphoretic.  Psychiatric: Mood and affect normal.      Assessment & Plan  Problem List Items Addressed This Visit      Cardiovascular and Mediastinum   Essential hypertension   Relevant Medications   hydrochlorothiazide (HYDRODIURIL) 25 MG tablet   amLODipine (NORVASC) 5 MG tablet   Other Relevant Orders   Renal Function Panel     Other   HLD (hyperlipidemia) - Primary   Relevant Medications   hydrochlorothiazide (HYDRODIURIL) 25 MG tablet   amLODipine (NORVASC) 5 MG tablet   Other Relevant Orders   Lipid Profile      Meds ordered this encounter  Medications  . hydrochlorothiazide (HYDRODIURIL) 25 MG tablet    Sig: Take 1 tablet (25 mg total) by mouth daily.    Dispense:  90 tablet    Refill:  1  . amLODipine (NORVASC) 5 MG tablet    Sig: Take 1 tablet (5 mg total) by mouth daily.    Dispense:  90 tablet    Refill:  1    Last time filling- sched appt      Dr. Otilio Miu [facility]  04/16/17

## 2017-04-16 NOTE — Patient Instructions (Signed)
Sarcoidosis Sarcoidosis is a disease that causes inflammation in your organs and other areas of your body. The lungs are most often affected (pulmonary sarcoidosis). Sarcoidosis can also affect your lymph nodes, liver, eyes, skin, or any other body tissue. When you have sarcoidosis, small clumps of tissue (granulomas) form in the affected area of your body. Granulomas are made up of your body's defense (immune) cells. Inflammation results when your body reacts to a harmful substance. Normally, inflammation goes away after immune cells get rid of the harmful substance. In sarcoidosis, the immune cells form granulomas instead. What are the causes? The exact cause of sarcoidosis is not known. Something triggers the immune system to respond, such as dust, chemicals, bacteria, or a virus. What increases the risk? You may be at a greater risk for sarcoidosis if you:  Have a family history of the disease.  Are African American.  Are of Northern European ancestry.  Are 20-50 years old.  Are female.  What are the signs or symptoms? Many people with sarcoidosis have no symptoms. Others have very mild symptoms. Sarcoidosis most often affects the lungs. Symptoms include:  Chest pain.  Coughing.  Wheezing.  Shortness of breath.  Other common symptoms include:  Night sweats.  Weight loss.  Fatigue.  Depression.  A sense of uneasiness.  How is this diagnosed? Sarcoidosis may be diagnosed by:  Medical history and physical exam.  Chest X-ray. This looks for granulomas in your lungs.  Lung function tests. These measure your breathing and look for problems related to sarcoidosis.  Examining a sample of tissue under a microscope (biopsy).  How is this treated? Sarcoidosis usually clears up without treatment. You may take medicines to reduce inflammation or relieve symptoms. These may include:  Prednisone. This steroid reduces inflammation related to sarcoidosis.  Chloroquine or  hydroxychloroquine. These are antimalarial medicines used to treat sarcoidosis that affects the skin or brain.  Methotrexate, leflunomide, or azathioprine. These medicines affect the immune system and can help with sarcoidosis in the joints, eyes, skin, or lungs.  Inhalers. Inhaled medicines can help you breathe if sarcoidosis is affecting your lungs.  Follow these instructions at home:  Do not use any tobacco products, including cigarettes, chewing tobacco, or electronic cigarettes. If you need help quitting, ask your health care provider.  Avoid secondhand smoke.  Avoid irritating dust and chemicals. Stay indoors on days when air quality is poor in your area.  Take medicines only as directed by your health care provider. Contact a health care provider if:  You have vision problems.  You have shortness of breath.  You have a dry, persistent cough.  You have an irregular heartbeat.  You have pain or ache in your joints, hands, or feet.  You have an unexplained rash. Get help right away if: You have chest pain. This information is not intended to replace advice given to you by your health care provider. Make sure you discuss any questions you have with your health care provider. Document Released: 04/15/2004 Document Revised: 11/21/2015 Document Reviewed: 10/11/2013 Elsevier Interactive Patient Education  2018 [employer]  

## 2017-04-17 LAB — RENAL FUNCTION PANEL
Albumin: 4.4 g/dL (ref 3.5–5.5)
BUN/Creatinine Ratio: 17 (ref 9–23)
BUN: 12 mg/dL (ref 6–24)
CO2: 24 mmol/L (ref 20–29)
CREATININE: 0.72 mg/dL (ref 0.57–1.00)
Calcium: 10.1 mg/dL (ref 8.7–10.2)
Chloride: 99 mmol/L (ref 96–106)
GFR calc Af Amer: 115 mL/min/{1.73_m2} (ref >59)
GFR calc non Af Amer: 100 mL/min/{1.73_m2} (ref >59)
GLUCOSE: 74 mg/dL (ref 65–99)
Phosphorus: 3.5 mg/dL (ref 2.5–4.5)
Potassium: 3.6 mmol/L (ref 3.5–5.2)
Sodium: 139 mmol/L (ref 134–144)

## 2017-04-17 LAB — LIPID PANEL
CHOL/HDL RATIO: 3 ratio (ref 0.0–4.4)
Cholesterol, Total: 252 mg/dL — ABNORMAL HIGH (ref 100–199)
HDL: 83 mg/dL (ref >39)
LDL CALC: 153 mg/dL — AB (ref 0–99)
TRIGLYCERIDES: 78 mg/dL (ref 0–149)
VLDL Cholesterol Cal: 16 mg/dL (ref 5–40)

## 2017-04-20 ENCOUNTER — Ambulatory Visit
Admission: RE | Admit: 2017-04-20 | Discharge: 2017-04-20 | Disposition: A | Discharge status: Home / Self Care | Payer: BC Managed Care – PPO | Source: Ambulatory Visit | Attending: Family Medicine | Admitting: Family Medicine

## 2017-04-20 DIAGNOSIS — Z1231 Encounter for screening mammogram for malignant neoplasm of breast: Secondary | ICD-10-CM | POA: Insufficient documentation

## 2017-05-03 ENCOUNTER — Ambulatory Visit: Payer: BC Managed Care – PPO | Admitting: Family Medicine

## 2017-05-31 ENCOUNTER — Other Ambulatory Visit: Payer: Self-pay

## 2017-06-16 ENCOUNTER — Other Ambulatory Visit: Payer: Self-pay

## 2017-08-27 ENCOUNTER — Other Ambulatory Visit: Payer: Self-pay

## 2017-10-15 ENCOUNTER — Ambulatory Visit: Payer: BC Managed Care – PPO | Admitting: Family Medicine

## 2017-10-15 ENCOUNTER — Encounter: Payer: Self-pay | Admitting: Family Medicine

## 2017-10-15 VITALS — BP 120/88 | HR 72 | Ht 62.0 in | Wt 149.0 lb

## 2017-10-15 DIAGNOSIS — I10 Essential (primary) hypertension: Secondary | ICD-10-CM | POA: Diagnosis not present

## 2017-10-15 MED ORDER — AMLODIPINE BESYLATE 5 MG PO TABS: 5.0000 mg | ORAL_TABLET | Freq: Every day | ORAL | 2 refills | Status: DC

## 2017-10-15 MED ORDER — HYDROCHLOROTHIAZIDE 25 MG PO TABS: 25.0000 mg | ORAL_TABLET | Freq: Every day | ORAL | 2 refills | Status: DC

## 2017-10-15 NOTE — Progress Notes (Signed)
Name: Sarah Mcgrath   MRN: [id number]    DOB: [birth year]   Date:10/15/2017       Progress Note  Subjective  Chief Complaint  Chief Complaint  Patient presents with  . Hypertension    Hypertension  This is a chronic problem. The current episode started more than 1 year ago. The problem has been waxing and waning since onset. The problem is controlled. Pertinent negatives include no anxiety, blurred vision, chest pain, headaches, malaise/fatigue, neck pain, palpitations, peripheral edema, shortness of breath or sweats. There are no associated agents to hypertension. There are no known risk factors for coronary artery disease. Past treatments include calcium channel blockers and diuretics. The current treatment provides moderate improvement. There are no compliance problems.  There is no history of angina, kidney disease, CAD/MI, CVA, left ventricular hypertrophy, PVD or retinopathy. There is no history of chronic renal disease, a hypertension causing med or renovascular disease.    No problem-specific Assessment & Plan notes found for this encounter.   Past Medical History:  Diagnosis Date  . Fibroid uterus   . Hypertension     Past Surgical History:  Procedure Laterality Date  . fibroid      Family History  Problem Relation Age of Onset  . Diabetes Mother   . Heart disease Mother   . Hypertension Father   . Stroke Maternal Grandmother   . Stroke Maternal Grandfather   . Breast cancer Neg Hx     Social History   Socioeconomic History  . Marital status: Single    Spouse name: Not on file  . Number of children: Not on file  . Years of education: Not on file  . Highest education level: Not on file  Occupational History  . Not on file  Social Needs  . Financial resource strain: Not on file  . Food insecurity:    Worry: Not on file    Inability: Not on file  . Transportation needs:    Medical: Not on file    Non-medical: Not on file  Tobacco Use  . Smoking  status: Never Smoker  . Smokeless tobacco: Never Used  Substance and Sexual Activity  . Alcohol use: Yes    Alcohol/week: 0.0 oz  . Drug use: No  . Sexual activity: Yes  Lifestyle  . Physical activity:    Days per week: Not on file    Minutes per session: Not on file  . Stress: Not on file  Relationships  . Social connections:    Talks on phone: Not on file    Gets together: Not on file    Attends religious service: Not on file    Active member of club or organization: Not on file    Attends meetings of clubs or organizations: Not on file    Relationship status: Not on file  . Intimate partner violence:    Fear of current or ex partner: Not on file    Emotionally abused: Not on file    Physically abused: Not on file    Forced sexual activity: Not on file  Other Topics Concern  . Not on file  Social History Narrative  . Not on file    No Known Allergies  Outpatient Medications Prior to Visit  Medication Sig Dispense Refill  . amLODipine (NORVASC) 5 MG tablet Take 1 tablet (5 mg total) by mouth daily. 90 tablet 1  . hydrochlorothiazide (HYDRODIURIL) 25 MG tablet Take 1 tablet (25 mg total) by mouth daily.  90 tablet 1   No facility-administered medications prior to visit.     Review of Systems  Constitutional: Negative for chills, fever, malaise/fatigue and weight loss.  HENT: Negative for ear discharge, ear pain and sore throat.   Eyes: Negative for blurred vision.  Respiratory: Negative for cough, sputum production, shortness of breath and wheezing.   Cardiovascular: Negative for chest pain, palpitations and leg swelling.  Gastrointestinal: Negative for abdominal pain, blood in stool, constipation, diarrhea, heartburn, melena and nausea.  Genitourinary: Negative for dysuria, frequency, hematuria and urgency.  Musculoskeletal: Negative for back pain, joint pain, myalgias and neck pain.  Skin: Negative for rash.  Neurological: Negative for dizziness, tingling, sensory  change, focal weakness and headaches.  Endo/Heme/Allergies: Negative for environmental allergies and polydipsia. Does not bruise/bleed easily.  Psychiatric/Behavioral: Negative for depression and suicidal ideas. The patient is not nervous/anxious and does not have insomnia.      Objective  Vitals:   10/15/17 0910  BP: 120/88  Pulse: 72  Weight: 149 lb (67.6 kg)  Height: 5\' 2"  (1.575 m)    Physical Exam  Constitutional: She is oriented to person, place, and time. She appears well-developed and well-nourished.  HENT:  Head: Normocephalic.  Right Ear: External ear normal.  Left Ear: External ear normal.  Mouth/Throat: Oropharynx is clear and moist.  Eyes: Pupils are equal, round, and reactive to light. Conjunctivae and EOM are normal. Lids are everted and swept, no foreign bodies found. Left eye exhibits no hordeolum. No foreign body present in the left eye. Right conjunctiva is not injected. Left conjunctiva is not injected. No scleral icterus.  Neck: Normal range of motion. Neck supple. No JVD present. No tracheal deviation present. No thyromegaly present.  Cardiovascular: Normal rate, regular rhythm, normal heart sounds and intact distal pulses. Exam reveals no gallop and no friction rub.  No murmur heard. Pulmonary/Chest: Effort normal and breath sounds normal. No respiratory distress. She has no wheezes. She has no rales.  Abdominal: Soft. Bowel sounds are normal. She exhibits no mass. There is no hepatosplenomegaly. There is no tenderness. There is no rebound and no guarding.  Musculoskeletal: Normal range of motion. She exhibits no edema or tenderness.  Lymphadenopathy:    She has no cervical adenopathy.  Neurological: She is alert and oriented to person, place, and time. She has normal strength. She displays normal reflexes. No cranial nerve deficit.  Skin: Skin is warm. No rash noted.  Psychiatric: She has a normal mood and affect. Her mood appears not anxious. She does not  exhibit a depressed mood.  Nursing note and vitals reviewed.     Assessment & Plan  Problem List Items Addressed This Visit      Cardiovascular and Mediastinum   Essential hypertension - Primary   Relevant Medications   amLODipine (NORVASC) 5 MG tablet   hydrochlorothiazide (HYDRODIURIL) 25 MG tablet      Meds ordered this encounter  Medications  . amLODipine (NORVASC) 5 MG tablet    Sig: Take 1 tablet (5 mg total) by mouth daily.    Dispense:  90 tablet    Refill:  2    Last time filling- sched appt  . hydrochlorothiazide (HYDRODIURIL) 25 MG tablet    Sig: Take 1 tablet (25 mg total) by mouth daily.    Dispense:  90 tablet    Refill:  2      Dr. Otilio Miu [facility]  10/15/17

## 2018-03-22 ENCOUNTER — Other Ambulatory Visit: Payer: Self-pay | Admitting: Family Medicine

## 2018-03-22 DIAGNOSIS — Z1231 Encounter for screening mammogram for malignant neoplasm of breast: Secondary | ICD-10-CM

## 2018-04-25 ENCOUNTER — Ambulatory Visit
Admission: RE | Admit: 2018-04-25 | Discharge: 2018-04-25 | Disposition: A | Discharge status: Home / Self Care | Payer: BC Managed Care – PPO | Source: Ambulatory Visit | Attending: Family Medicine | Admitting: Family Medicine

## 2018-04-25 DIAGNOSIS — Z1231 Encounter for screening mammogram for malignant neoplasm of breast: Secondary | ICD-10-CM | POA: Diagnosis present

## 2018-06-16 ENCOUNTER — Ambulatory Visit: Payer: BC Managed Care – PPO | Admitting: Family Medicine

## 2018-06-17 ENCOUNTER — Encounter: Payer: Self-pay | Admitting: Family Medicine

## 2018-06-17 ENCOUNTER — Ambulatory Visit
Admission: RE | Admit: 2018-06-17 | Discharge: 2018-06-17 | Disposition: A | Discharge status: Home / Self Care | Payer: BC Managed Care – PPO | Source: Ambulatory Visit | Attending: Family Medicine | Admitting: Family Medicine

## 2018-06-17 ENCOUNTER — Ambulatory Visit: Payer: BC Managed Care – PPO | Admitting: Family Medicine

## 2018-06-17 ENCOUNTER — Ambulatory Visit
Admission: RE | Admit: 2018-06-17 | Discharge: 2018-06-17 | Disposition: A | Discharge status: Home / Self Care | Payer: BC Managed Care – PPO | Attending: Family Medicine | Admitting: Family Medicine

## 2018-06-17 VITALS — BP 120/70 | HR 60 | Ht 62.0 in | Wt 153.0 lb

## 2018-06-17 DIAGNOSIS — D869 Sarcoidosis, unspecified: Secondary | ICD-10-CM | POA: Diagnosis present

## 2018-06-17 DIAGNOSIS — I10 Essential (primary) hypertension: Secondary | ICD-10-CM

## 2018-06-17 MED ORDER — AMLODIPINE BESYLATE 5 MG PO TABS: 5.0000 mg | ORAL_TABLET | Freq: Every day | ORAL | 2 refills | Status: DC

## 2018-06-17 MED ORDER — HYDROCHLOROTHIAZIDE 25 MG PO TABS: 25.0000 mg | ORAL_TABLET | Freq: Every day | ORAL | 2 refills | Status: DC

## 2018-06-17 NOTE — Progress Notes (Signed)
Date:  06/17/2018   Name:  Sarah Mcgrath   DOB:  27-Mar-1971   MRN:  [id number]   Chief Complaint: Hypertension ([facility])  Hypertension  This is a chronic problem. The current episode started more than 1 year ago. The problem is unchanged. The problem is controlled. Pertinent negatives include no anxiety, blurred vision, chest pain, headaches, malaise/fatigue, neck pain, orthopnea, palpitations, peripheral edema, PND, shortness of breath or sweats. There are no associated agents to hypertension. There are no known risk factors for coronary artery disease. Past treatments include calcium channel blockers and diuretics. The current treatment provides moderate improvement. There are no compliance problems.  There is no history of angina, kidney disease, CAD/MI, CVA, heart failure, left ventricular hypertrophy, PVD or retinopathy. There is no history of chronic renal disease, a hypertension causing med or renovascular disease.    Review of Systems  Constitutional: Negative.  Negative for chills, fatigue, fever, malaise/fatigue and unexpected weight change.  HENT: Negative for congestion, ear discharge, ear pain, rhinorrhea, sinus pressure, sneezing and sore throat.   Eyes: Negative for blurred vision, photophobia, pain, discharge, redness and itching.  Respiratory: Negative for cough, shortness of breath, wheezing and stridor.   Cardiovascular: Negative for chest pain, palpitations, orthopnea and PND.  Gastrointestinal: Negative for abdominal pain, blood in stool, constipation, diarrhea, nausea and vomiting.  Endocrine: Negative for cold intolerance, heat intolerance, polydipsia, polyphagia and polyuria.  Genitourinary: Negative for dysuria, flank pain, frequency, hematuria and urgency.  Musculoskeletal: Negative for arthralgias, back pain, myalgias and neck pain.  Skin: Negative for rash.  Allergic/Immunologic: Negative for environmental allergies and food allergies.    Neurological: Negative for dizziness, weakness, light-headedness, numbness and headaches.  Hematological: Negative for adenopathy. Does not bruise/bleed easily.  Psychiatric/Behavioral: Negative for dysphoric mood. The patient is not nervous/anxious.     Patient Active Problem List   Diagnosis Date Noted  . Essential hypertension 11/06/2014  . HLD (hyperlipidemia) 11/06/2014  . Drug noncompliance 11/06/2014  . Screening for depression 11/06/2014    No Known Allergies  Past Surgical History:  Procedure Laterality Date  . fibroid      Social History   Tobacco Use  . Smoking status: Never Smoker  . Smokeless tobacco: Never Used  Substance Use Topics  . Alcohol use: Yes    Alcohol/week: 0.0 standard drinks  . Drug use: No     Medication list has been reviewed and updated.  Current Meds  Medication Sig  . amLODipine (NORVASC) 5 MG tablet Take 1 tablet (5 mg total) by mouth daily.  . hydrochlorothiazide (HYDRODIURIL) 25 MG tablet Take 1 tablet (25 mg total) by mouth daily.    PHQ 2/9 Scores 06/17/2018 10/15/2017  PHQ - 2 Score 0 0  PHQ- 9 Score 0 0    Physical Exam HENT:     Head: Normocephalic.     Right Ear: External ear normal.     Left Ear: External ear normal.     Nose: Nose normal.  Eyes:     General: No scleral icterus.       Right eye: No discharge.        Left eye: No discharge.     Conjunctiva/sclera: Conjunctivae normal.     Pupils: Pupils are equal, round, and reactive to light.  Neck:     Musculoskeletal: Normal range of motion and neck supple.     Thyroid: No thyromegaly.     Vascular: No JVD.     Trachea: No tracheal  deviation.  Cardiovascular:     Rate and Rhythm: Normal rate and regular rhythm.     Heart sounds: Normal heart sounds. No murmur. No friction rub. No gallop.   Pulmonary:     Effort: No respiratory distress.     Breath sounds: Normal breath sounds. No wheezing or rales.  Abdominal:     General: Bowel sounds are normal.      Palpations: Abdomen is soft. There is no mass.     Tenderness: There is no abdominal tenderness. There is no guarding or rebound.  Musculoskeletal: Normal range of motion.        General: No tenderness.  Lymphadenopathy:     Cervical: No cervical adenopathy.  Skin:    General: Skin is warm.     Findings: No rash.  Neurological:     Mental Status: He is alert and oriented to person, place, and time.     Cranial Nerves: No cranial nerve deficit.     Deep Tendon Reflexes: Reflexes are normal and symmetric.     BP 120/70   Pulse 60   Ht 5\' 2"  (1.575 m)   Wt 153 lb (69.4 kg)   BMI 27.98 kg/m   Assessment and Plan: 1. Essential hypertension Chronic. Controlled. Stable on meds- refill amlodipine and HCTZ/ draw renal panel - amLODipine (NORVASC) 5 MG tablet; Take 1 tablet (5 mg total) by mouth daily.  Dispense: 90 tablet; Refill: 2 - hydrochlorothiazide (HYDRODIURIL) 25 MG tablet; Take 1 tablet (25 mg total) by mouth daily.  Dispense: 90 tablet; Refill: 2 - Renal Function Panel  2. Sarcoid Order chest xray - DG Chest 2 View; Future

## 2018-06-18 LAB — RENAL FUNCTION PANEL
Albumin: 4.5 g/dL (ref 3.5–5.5)
BUN/Creatinine Ratio: 21 (ref 9–23)
BUN: 15 mg/dL (ref 6–24)
CO2: 25 mmol/L (ref 20–29)
Calcium: 10.1 mg/dL (ref 8.7–10.2)
Chloride: 102 mmol/L (ref 96–106)
Creatinine, Ser: 0.72 mg/dL (ref 0.57–1.00)
GFR calc Af Amer: 115 mL/min/{1.73_m2} (ref >59)
GFR calc non Af Amer: 99 mL/min/{1.73_m2} (ref >59)
Glucose: 91 mg/dL (ref 65–99)
Phosphorus: 3.3 mg/dL (ref 2.5–4.5)
Potassium: 3.7 mmol/L (ref 3.5–5.2)
Sodium: 142 mmol/L (ref 134–144)

## 2018-08-05 IMAGING — CR DG CHEST 2V
2 series · 2 of 2 positions shown · non-contrast
Comparison: November 13, 2015

CLINICAL DATA: History of sarcoid.  Follow-up.

EXAM:
CHEST  2 VIEW

[chest pa]
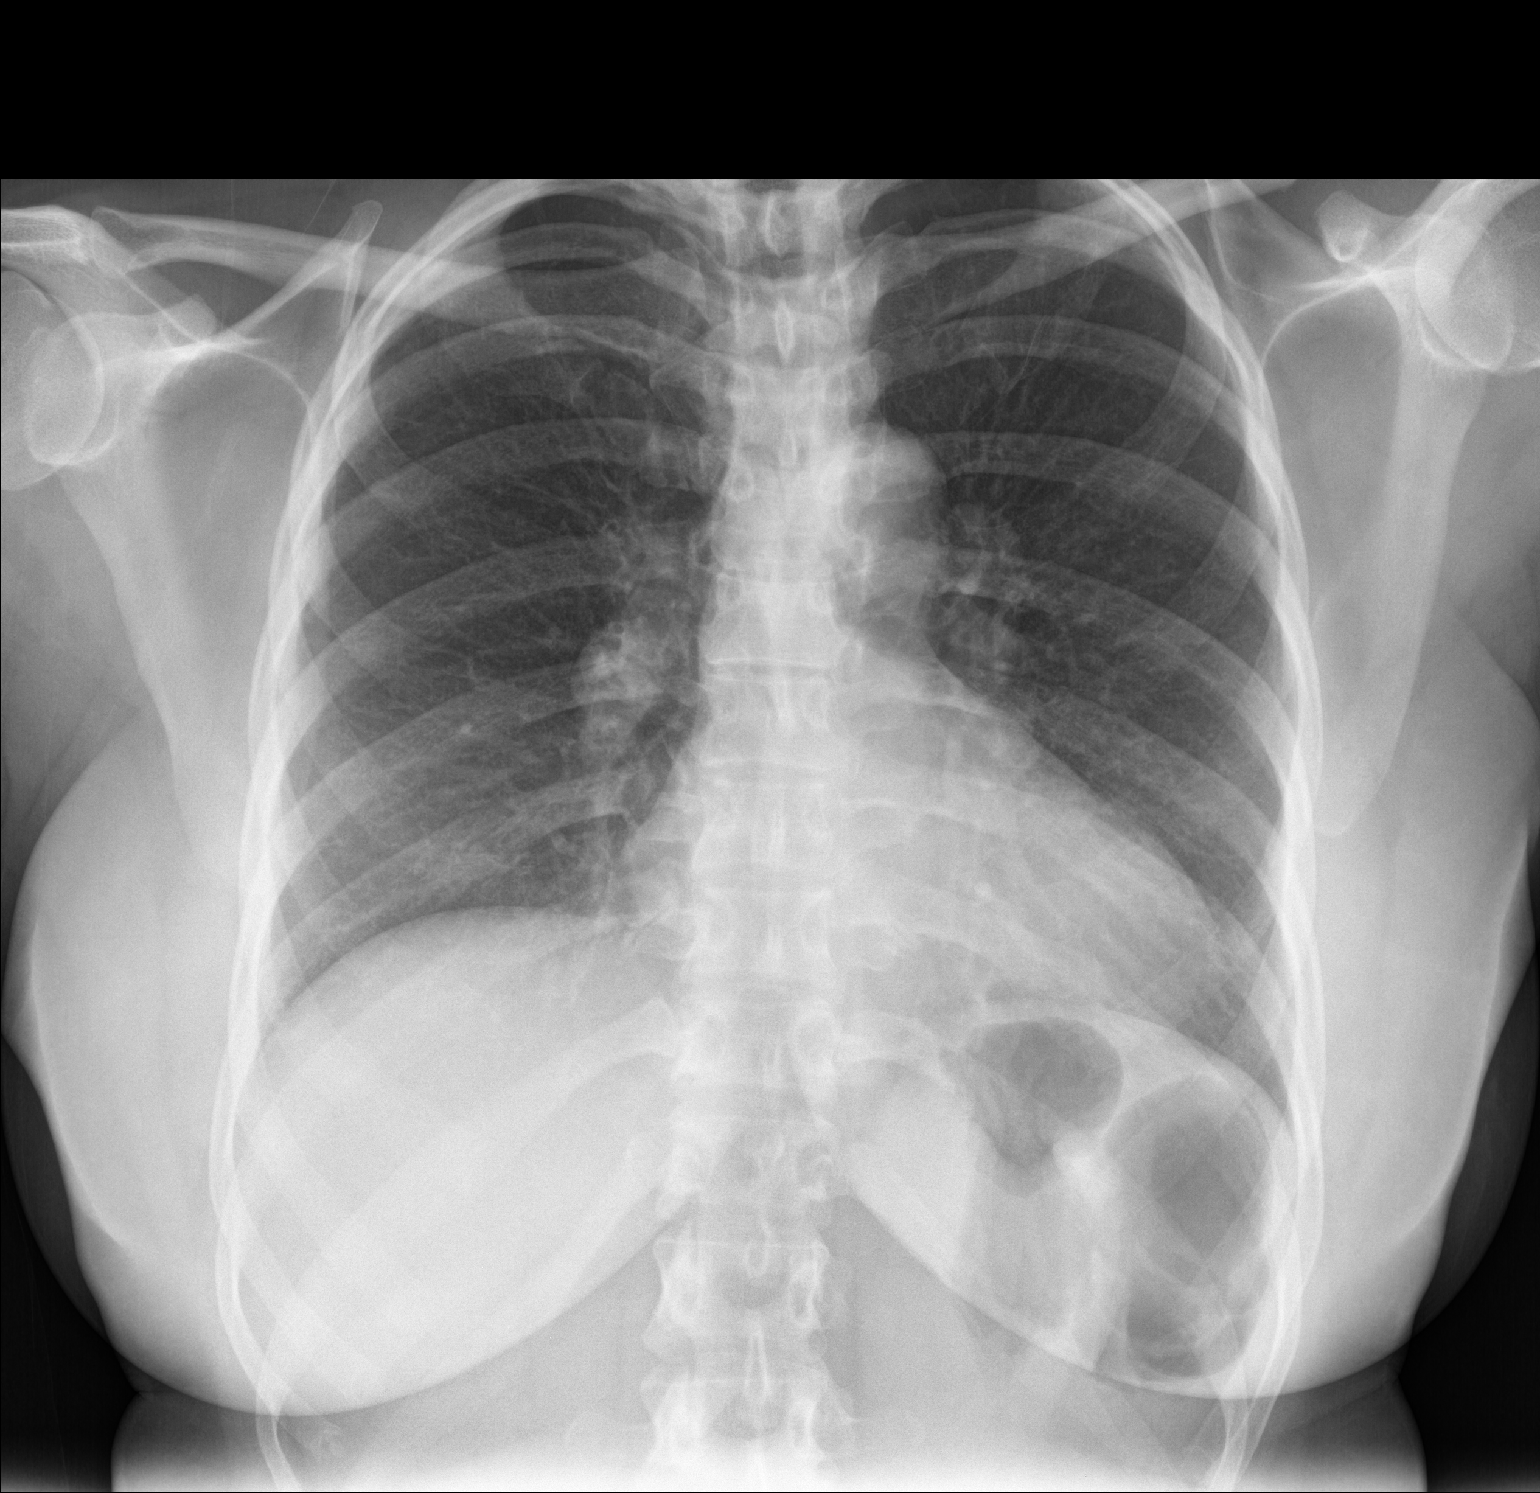

[chest lat]
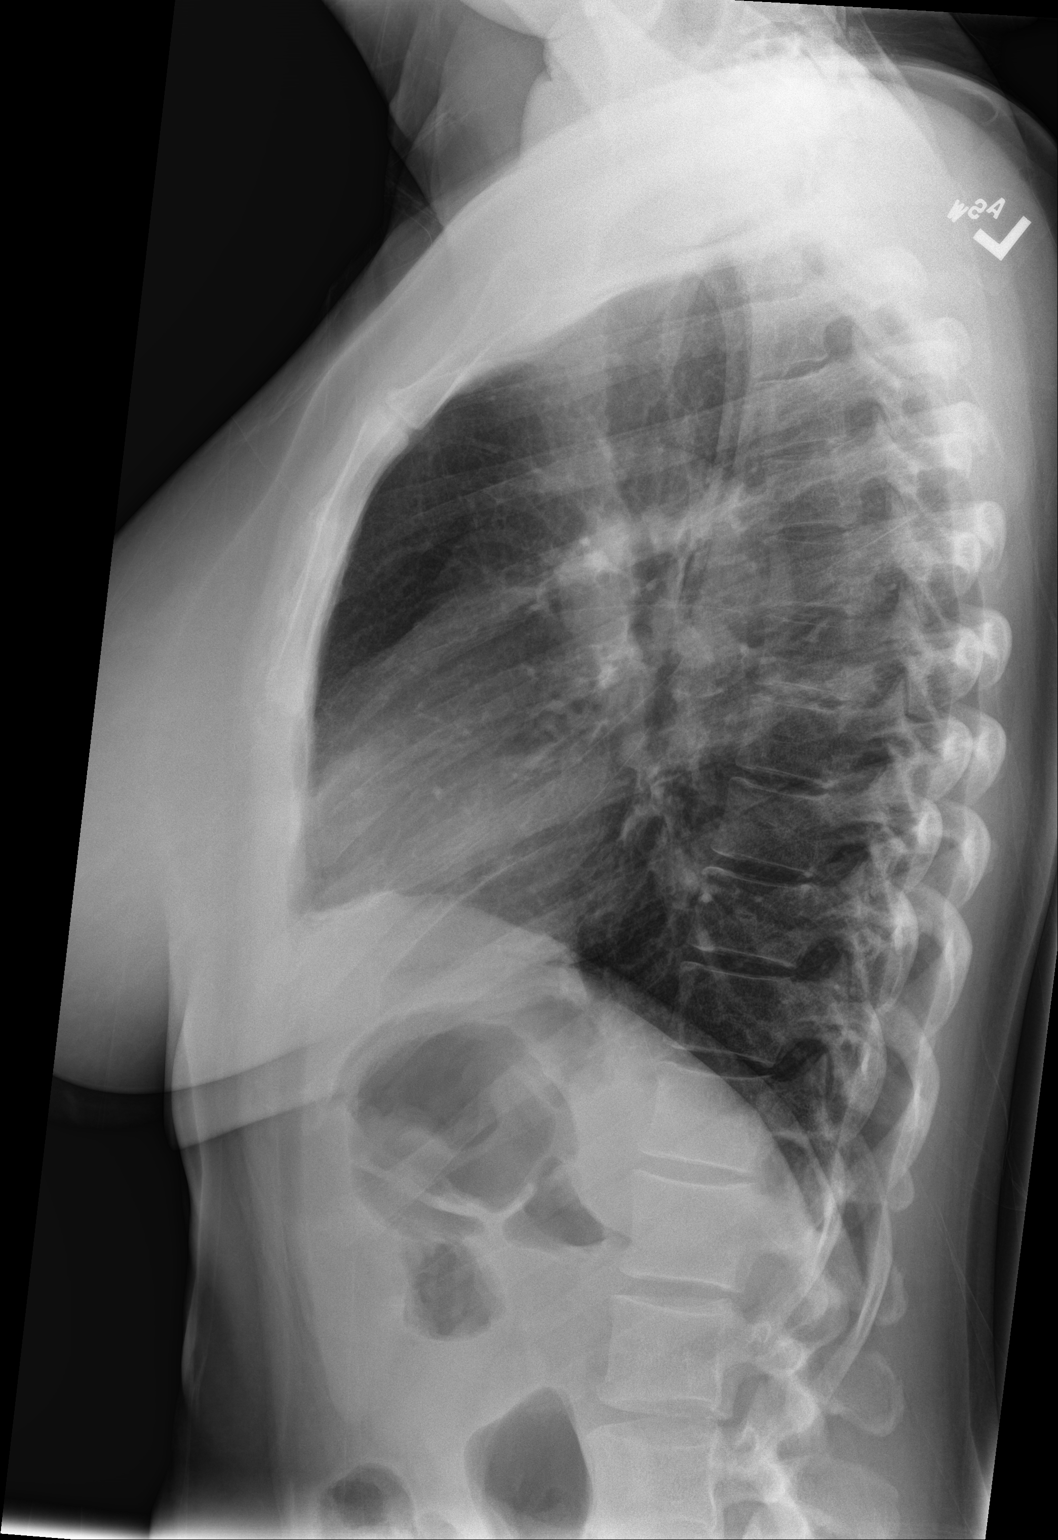

[2 of 2 positions shown; findings below may reference images not displayed]

FINDINGS: The heart and mediastinum are normal. The hilar symmetric and stable
since October 2015. It would be difficult to completely exclude
prominent nodes in the hila. No pneumothorax. No nodules or masses.
No focal infiltrates.
IMPRESSION: No interval change since October 2015. The hila are symmetric and
stable. It would be difficult to exclude prominent nodes in the hila
but there has been no change. No evidence of mediastinal adenopathy
or pulmonary pathology.

## 2018-12-20 ENCOUNTER — Ambulatory Visit: Payer: BC Managed Care – PPO | Admitting: Family Medicine

## 2018-12-20 ENCOUNTER — Encounter: Payer: Self-pay | Admitting: Family Medicine

## 2018-12-20 ENCOUNTER — Other Ambulatory Visit: Payer: Self-pay

## 2018-12-20 VITALS — BP 130/70 | HR 78 | Ht 62.0 in | Wt 155.0 lb

## 2018-12-20 DIAGNOSIS — E7801 Familial hypercholesterolemia: Secondary | ICD-10-CM | POA: Diagnosis not present

## 2018-12-20 DIAGNOSIS — I10 Essential (primary) hypertension: Secondary | ICD-10-CM

## 2018-12-20 MED ORDER — HYDROCHLOROTHIAZIDE 25 MG PO TABS: 25.0000 mg | ORAL_TABLET | Freq: Every day | ORAL | 1 refills | Status: DC

## 2018-12-20 MED ORDER — AMLODIPINE BESYLATE 5 MG PO TABS: 5.0000 mg | ORAL_TABLET | Freq: Every day | ORAL | 1 refills | Status: DC

## 2018-12-20 NOTE — Progress Notes (Signed)
Date:  12/20/2018   Name:  Sarah Mcgrath   DOB:  1970/08/20   MRN:  [id number]   Chief Complaint: Hypertension (good on meds- wants lipids checked)  Hypertension This is a chronic problem. The current episode started more than 1 year ago. The problem has been gradually improving since onset. The problem is controlled. Pertinent negatives include no anxiety, blurred vision, chest pain, headaches, malaise/fatigue, neck pain, orthopnea, palpitations, peripheral edema, PND, shortness of breath or sweats. There are no associated agents to hypertension. There are no known risk factors for coronary artery disease. Past treatments include calcium channel blockers and diuretics. The current treatment provides moderate improvement. There are no compliance problems.  There is no history of angina, kidney disease, CAD/MI, CVA, heart failure, left ventricular hypertrophy, PVD or retinopathy. There is no history of chronic renal disease, a hypertension causing med or renovascular disease.    Review of Systems  Constitutional: Negative.  Negative for chills, fatigue, fever, malaise/fatigue and unexpected weight change.  HENT: Negative for congestion, ear discharge, ear pain, rhinorrhea, sinus pressure, sneezing and sore throat.   Eyes: Negative for blurred vision, photophobia, pain, discharge, redness and itching.  Respiratory: Negative for cough, shortness of breath, wheezing and stridor.   Cardiovascular: Negative for chest pain, palpitations, orthopnea and PND.  Gastrointestinal: Negative for abdominal pain, blood in stool, constipation, diarrhea, nausea and vomiting.  Endocrine: Negative for cold intolerance, heat intolerance, polydipsia, polyphagia and polyuria.  Genitourinary: Negative for dysuria, flank pain, frequency, hematuria and urgency.  Musculoskeletal: Negative for arthralgias, back pain, myalgias and neck pain.  Skin: Negative for rash.  Allergic/Immunologic: Negative for  environmental allergies and food allergies.  Neurological: Negative for dizziness, weakness, light-headedness, numbness and headaches.  Hematological: Negative for adenopathy. Does not bruise/bleed easily.  Psychiatric/Behavioral: Negative for dysphoric mood. The patient is not nervous/anxious.     Patient Active Problem List   Diagnosis Date Noted  . Essential hypertension 11/06/2014  . HLD (hyperlipidemia) 11/06/2014  . Drug noncompliance 11/06/2014  . Screening for depression 11/06/2014    No Known Allergies  Past Surgical History:  Procedure Laterality Date  . fibroid      Social History   Tobacco Use  . Smoking status: Never Smoker  . Smokeless tobacco: Never Used  Substance Use Topics  . Alcohol use: Yes    Alcohol/week: 0.0 standard drinks  . Drug use: No     Medication list has been reviewed and updated.  Current Meds  Medication Sig  . amLODipine (NORVASC) 5 MG tablet Take 1 tablet (5 mg total) by mouth daily.  . hydrochlorothiazide (HYDRODIURIL) 25 MG tablet Take 1 tablet (25 mg total) by mouth daily.    PHQ 2/9 Scores 12/20/2018 06/17/2018 10/15/2017  PHQ - 2 Score 0 0 0  PHQ- 9 Score 0 0 0    BP Readings from Last 3 Encounters:  12/20/18 130/70  06/17/18 120/70  10/15/17 120/88    Physical Exam Vitals signs and nursing note reviewed.  Constitutional:      General: He is not in acute distress.    Appearance: He is not diaphoretic.  HENT:     Head: Normocephalic and atraumatic.     Right Ear: Tympanic membrane, ear canal and external ear normal.     Left Ear: Tympanic membrane, ear canal and external ear normal.     Nose: Nose normal. No congestion or rhinorrhea.  Eyes:     General:  Right eye: No discharge.        Left eye: No discharge.     Conjunctiva/sclera: Conjunctivae normal.     Pupils: Pupils are equal, round, and reactive to light.  Neck:     Musculoskeletal: Normal range of motion and neck supple.     Thyroid: No  thyromegaly.     Vascular: No JVD.  Cardiovascular:     Rate and Rhythm: Normal rate and regular rhythm.     Heart sounds: Normal heart sounds. No murmur. No friction rub. No gallop.   Pulmonary:     Effort: Pulmonary effort is normal.     Breath sounds: Normal breath sounds.  Abdominal:     General: Bowel sounds are normal.     Palpations: Abdomen is soft. There is no mass.     Tenderness: There is no abdominal tenderness. There is no guarding.  Musculoskeletal: Normal range of motion.  Lymphadenopathy:     Cervical: No cervical adenopathy.  Skin:    General: Skin is warm and dry.  Neurological:     Mental Status: He is alert.     Deep Tendon Reflexes: Reflexes are normal and symmetric.     Wt Readings from Last 3 Encounters:  12/20/18 155 lb (70.3 kg)  06/17/18 153 lb (69.4 kg)  10/15/17 149 lb (67.6 kg)    BP 130/70   Pulse 78   Ht 5\' 2"  (1.575 m)   Wt 155 lb (70.3 kg)   BMI 28.35 kg/m   Assessment and Plan:  1. Essential hypertension Chronic.  Controlled.  Continue amlodipine 5 mg, hydrochlorothiazide 25 mg.  Will check renal function panel.  Will recheck in 6 months. - Renal Function Panel - amLODipine (NORVASC) 5 MG tablet; Take 1 tablet (5 mg total) by mouth daily.  Dispense: 90 tablet; Refill: 1 - hydrochlorothiazide (HYDRODIURIL) 25 MG tablet; Take 1 tablet (25 mg total) by mouth daily.  Dispense: 90 tablet; Refill: 1  2. Familial hypercholesterolemia Chronic.  Controlled.  Continue with diet will check lipid panel patient is fasting today to evaluate.  Have noted that LDLs have been in abated in the past and we will check to see if current weight and diet will control her LDL status. - Lipid panel

## 2018-12-20 NOTE — Patient Instructions (Signed)

## 2018-12-21 LAB — LIPID PANEL
Chol/HDL Ratio: 3.9 ratio (ref 0.0–4.4)
Cholesterol, Total: 252 mg/dL — ABNORMAL HIGH (ref 100–199)
HDL: 64 mg/dL (ref >39)
LDL Calculated: 167 mg/dL — ABNORMAL HIGH (ref 0–99)
Triglycerides: 107 mg/dL (ref 0–149)
VLDL Cholesterol Cal: 21 mg/dL (ref 5–40)

## 2018-12-21 LAB — RENAL FUNCTION PANEL
Albumin: 4.3 g/dL (ref 3.8–4.8)
BUN/Creatinine Ratio: 16 (ref 9–23)
BUN: 13 mg/dL (ref 6–24)
CO2: 24 mmol/L (ref 20–29)
Calcium: 9.8 mg/dL (ref 8.7–10.2)
Chloride: 101 mmol/L (ref 96–106)
Creatinine, Ser: 0.8 mg/dL (ref 0.57–1.00)
GFR calc Af Amer: 101 mL/min/{1.73_m2} (ref >59)
GFR calc non Af Amer: 87 mL/min/{1.73_m2} (ref >59)
Glucose: 99 mg/dL (ref 65–99)
Phosphorus: 3.5 mg/dL (ref 3.0–4.3)
Potassium: 3.8 mmol/L (ref 3.5–5.2)
Sodium: 139 mmol/L (ref 134–144)

## 2019-02-13 ENCOUNTER — Ambulatory Visit: Payer: BC Managed Care – PPO

## 2019-02-13 DIAGNOSIS — E7801 Familial hypercholesterolemia: Secondary | ICD-10-CM

## 2019-02-14 ENCOUNTER — Other Ambulatory Visit: Payer: Self-pay

## 2019-02-14 DIAGNOSIS — E7801 Familial hypercholesterolemia: Secondary | ICD-10-CM

## 2019-02-14 LAB — LIPID PANEL WITH LDL/HDL RATIO
Cholesterol, Total: 225 mg/dL — ABNORMAL HIGH (ref 100–199)
HDL: 64 mg/dL (ref >39)
LDL Calculated: 130 mg/dL — ABNORMAL HIGH (ref 0–99)
LDl/HDL Ratio: 2 ratio (ref 0.0–3.2)
Triglycerides: 153 mg/dL — ABNORMAL HIGH (ref 0–149)
VLDL Cholesterol Cal: 31 mg/dL (ref 5–40)

## 2019-02-14 MED ORDER — ATORVASTATIN CALCIUM 10 MG PO TABS: 10.0000 mg | ORAL_TABLET | Freq: Every day | ORAL | 1 refills | Status: DC

## 2019-02-14 NOTE — Progress Notes (Unsigned)
Sent in Atorv 10mg 

## 2019-03-20 ENCOUNTER — Other Ambulatory Visit: Payer: Self-pay | Admitting: Family Medicine

## 2019-03-20 DIAGNOSIS — Z1231 Encounter for screening mammogram for malignant neoplasm of breast: Secondary | ICD-10-CM

## 2019-04-17 ENCOUNTER — Other Ambulatory Visit: Payer: Self-pay

## 2019-04-17 DIAGNOSIS — E7801 Familial hypercholesterolemia: Secondary | ICD-10-CM

## 2019-04-17 MED ORDER — ATORVASTATIN CALCIUM 10 MG PO TABS: 10.0000 mg | ORAL_TABLET | Freq: Every day | ORAL | 1 refills | Status: DC

## 2019-04-18 ENCOUNTER — Other Ambulatory Visit: Payer: Self-pay

## 2019-04-18 DIAGNOSIS — E7801 Familial hypercholesterolemia: Secondary | ICD-10-CM

## 2019-04-18 MED ORDER — ATORVASTATIN CALCIUM 10 MG PO TABS: 10.0000 mg | ORAL_TABLET | Freq: Every day | ORAL | 1 refills | Status: DC

## 2019-05-10 ENCOUNTER — Ambulatory Visit
Admission: RE | Admit: 2019-05-10 | Discharge: 2019-05-10 | Disposition: A | Discharge status: Home / Self Care | Payer: BC Managed Care – PPO | Source: Ambulatory Visit | Attending: Family Medicine | Admitting: Family Medicine

## 2019-05-10 DIAGNOSIS — Z1231 Encounter for screening mammogram for malignant neoplasm of breast: Secondary | ICD-10-CM

## 2019-05-13 ENCOUNTER — Other Ambulatory Visit: Payer: Self-pay | Admitting: Family Medicine

## 2019-05-13 DIAGNOSIS — E7801 Familial hypercholesterolemia: Secondary | ICD-10-CM

## 2019-06-01 ENCOUNTER — Other Ambulatory Visit: Payer: Self-pay | Admitting: Family Medicine

## 2019-06-01 DIAGNOSIS — E7801 Familial hypercholesterolemia: Secondary | ICD-10-CM

## 2019-06-24 ENCOUNTER — Other Ambulatory Visit: Payer: Self-pay | Admitting: Family Medicine

## 2019-06-24 DIAGNOSIS — E7801 Familial hypercholesterolemia: Secondary | ICD-10-CM

## 2019-06-26 ENCOUNTER — Ambulatory Visit
Admission: RE | Admit: 2019-06-26 | Discharge: 2019-06-26 | Disposition: A | Discharge status: Home / Self Care | Payer: BC Managed Care – PPO | Attending: Family Medicine | Admitting: Family Medicine

## 2019-06-26 ENCOUNTER — Ambulatory Visit: Payer: BC Managed Care – PPO | Admitting: Family Medicine

## 2019-06-26 ENCOUNTER — Encounter: Payer: Self-pay | Admitting: Family Medicine

## 2019-06-26 ENCOUNTER — Ambulatory Visit
Admission: RE | Admit: 2019-06-26 | Discharge: 2019-06-26 | Disposition: A | Discharge status: Home / Self Care | Payer: BC Managed Care – PPO | Source: Ambulatory Visit | Attending: Family Medicine | Admitting: Family Medicine

## 2019-06-26 ENCOUNTER — Other Ambulatory Visit: Payer: Self-pay

## 2019-06-26 VITALS — BP 102/70 | HR 80 | Ht 62.0 in | Wt 155.0 lb

## 2019-06-26 DIAGNOSIS — D869 Sarcoidosis, unspecified: Secondary | ICD-10-CM

## 2019-06-26 DIAGNOSIS — I10 Essential (primary) hypertension: Secondary | ICD-10-CM

## 2019-06-26 DIAGNOSIS — E7801 Familial hypercholesterolemia: Secondary | ICD-10-CM | POA: Diagnosis not present

## 2019-06-26 DIAGNOSIS — R69 Illness, unspecified: Secondary | ICD-10-CM | POA: Diagnosis not present

## 2019-06-26 MED ORDER — ATORVASTATIN CALCIUM 10 MG PO TABS: 10.0000 mg | ORAL_TABLET | Freq: Every day | ORAL | 1 refills | Status: DC

## 2019-06-26 MED ORDER — HYDROCHLOROTHIAZIDE 25 MG PO TABS: 25.0000 mg | ORAL_TABLET | Freq: Every day | ORAL | 1 refills | Status: DC

## 2019-06-26 MED ORDER — AMLODIPINE BESYLATE 5 MG PO TABS: 5.0000 mg | ORAL_TABLET | Freq: Every day | ORAL | 1 refills | Status: DC

## 2019-06-26 NOTE — Patient Instructions (Addendum)
Why follow it? Research shows. . Those who follow the Mediterranean diet have a reduced risk of heart disease  . The diet is associated with a reduced incidence of Parkinson's and Alzheimer's diseases . People following the diet may have longer life expectancies and lower rates of chronic diseases  . The Dietary Guidelines for Americans recommends the Mediterranean diet as an eating plan to promote health and prevent disease  What Is the Mediterranean Diet?  . Healthy eating plan based on typical foods and recipes of Mediterranean-style cooking . The diet is primarily a plant based diet; these foods should make up a majority of meals   Starches - Plant based foods should make up a majority of meals - They are an important sources of vitamins, minerals, energy, antioxidants, and fiber - Choose whole grains, foods high in fiber and minimally processed items  - Typical grain sources include wheat, oats, barley, corn, brown rice, bulgar, farro, millet, polenta, couscous  - Various types of beans include chickpeas, lentils, fava beans, black beans, white beans   Fruits  Veggies - Large quantities of antioxidant rich fruits & veggies; 6 or more servings  - Vegetables can be eaten raw or lightly drizzled with oil and cooked  - Vegetables common to the traditional Mediterranean Diet include: artichokes, arugula, beets, broccoli, brussel sprouts, cabbage, carrots, celery, collard greens, cucumbers, eggplant, kale, leeks, lemons, lettuce, mushrooms, okra, onions, peas, peppers, potatoes, pumpkin, radishes, rutabaga, shallots, spinach, sweet potatoes, turnips, zucchini - Fruits common to the Mediterranean Diet include: apples, apricots, avocados, cherries, clementines, dates, figs, grapefruits, grapes, melons, nectarines, oranges, peaches, pears, pomegranates, strawberries, tangerines  Fats - Replace butter and margarine with healthy oils, such as olive oil, canola oil, and tahini  - Limit nuts to no  more than a handful a day  - Nuts include walnuts, almonds, pecans, pistachios, pine nuts  - Limit or avoid candied, honey roasted or heavily salted nuts - Olives are central to the Mediterranean diet - can be eaten whole or used in a variety of dishes   Meats Protein - Limiting red meat: no more than a few times a month - When eating red meat: choose lean cuts and keep the portion to the size of deck of cards - Eggs: approx. 0 to 4 times a week  - Fish and lean poultry: at least 2 a week  - Healthy protein sources include, chicken, turkey, lean beef, lamb - Increase intake of seafood such as tuna, salmon, trout, mackerel, shrimp, scallops - Avoid or limit high fat processed meats such as sausage and bacon  Dairy - Include moderate amounts of low fat dairy products  - Focus on healthy dairy such as fat free yogurt, skim milk, low or reduced fat cheese - Limit dairy products higher in fat such as whole or 2% milk, cheese, ice cream  Alcohol - Moderate amounts of red wine is ok  - No more than 5 oz daily for women (all ages) and men older than age 65  - No more than 10 oz of wine daily for men younger than 65  Other - Limit sweets and other desserts  - Use herbs and spices instead of salt to flavor foods  - Herbs and spices common to the traditional Mediterranean Diet include: basil, bay leaves, chives, cloves, cumin, fennel, garlic, lavender, marjoram, mint, oregano, parsley, pepper, rosemary, sage, savory, sumac, tarragon, thyme   It's not just a diet, it's a lifestyle:  . The Mediterranean diet includes   lifestyle factors typical of those in the region  . Foods, drinks and meals are best eaten with others and savored . Daily physical activity is important for overall good health . This could be strenuous exercise like running and aerobics . This could also be more leisurely activities such as walking, housework, yard-work, or taking the stairs . Moderation is the key; a balanced and  healthy diet accommodates most foods and drinks . Consider portion sizes and frequency of consumption of certain foods   Meal Ideas & Options:  . Breakfast:  o Whole wheat toast or whole wheat English muffins with peanut butter & hard boiled egg o Steel cut oats topped with apples & cinnamon and skim milk  o Fresh fruit: banana, strawberries, melon, berries, peaches  o Smoothies: strawberries, bananas, greek yogurt, peanut butter o Low fat greek yogurt with blueberries and granola  o Egg white omelet with spinach and mushrooms o Breakfast couscous: whole wheat couscous, apricots, skim milk, cranberries  . Sandwiches:  o Hummus and grilled vegetables (peppers, zucchini, squash) on whole wheat bread   o Grilled chicken on whole wheat pita with lettuce, tomatoes, cucumbers or tzatziki  o Tuna salad on whole wheat bread: tuna salad made with greek yogurt, olives, red peppers, capers, green onions o Garlic rosemary lamb pita: lamb sauted with garlic, rosemary, salt & pepper; add lettuce, cucumber, greek yogurt to pita - flavor with lemon juice and black pepper  . Seafood:  o Mediterranean grilled salmon, seasoned with garlic, basil, parsley, lemon juice and black pepper o Shrimp, lemon, and spinach whole-grain pasta salad made with low fat greek yogurt  o Seared scallops with lemon orzo  o Seared tuna steaks seasoned salt, pepper, coriander topped with tomato mixture of olives, tomatoes, olive oil, minced garlic, parsley, green onions and cappers  . Meats:  o Herbed greek chicken salad with kalamata olives, cucumber, feta  o Red bell peppers stuffed with spinach, bulgur, lean ground beef (or lentils) & topped with feta   o Kebabs: skewers of chicken, tomatoes, onions, zucchini, squash  o [place] burgers: made with red onions, mint, dill, lemon juice, feta cheese topped with roasted red peppers . Vegetarian o Cucumber salad: cucumbers, artichoke hearts, celery, red onion, feta cheese, tossed in  olive oil & lemon juice  o Hummus and whole grain pita points with a greek salad (lettuce, tomato, feta, olives, cucumbers, red onion) o Lentil soup with celery, carrots made with vegetable broth, garlic, salt and pepper  o Tabouli salad: parsley, bulgur, mint, scallions, cucumbers, tomato, radishes, lemon juice, olive oil, salt and pepper.      Sarcoidosis  Sarcoidosis is a disease that can cause inflammation in many areas of the body. It most often affects the lungs (pulmonary sarcoidosis). Sarcoidosis can also affect the lymph nodes, liver, eyes, skin, heart, or any other body tissue. Normally, cells that are part of your body's disease-fighting system (immune system) attack harmful substances (such as germs) in your body. This immune system response causes inflammation. After the harmful substance is destroyed, the inflammation and the immune cells go away. When you have sarcoidosis, your immune system causes inflammation even when there are no harmful substances, and the inflammation does not go away. Sarcoidosis also causes cells from your immune system to form small clumps of tissue (granulomas) in the affected area of your body. What are the causes? The exact cause of sarcoidosis is not known.  It is possible that if you have a family history  of this disease (genetic predisposition), the immune system response that leads to inflammation may be triggered by something in your environment, such as:  Bacteria or viruses.  Metals.  Chemicals.  Dust.  Mold or mildew. What increases the risk? You may be at a greater risk for sarcoidosis if you:  Have a family history of the disease.  Are African-American.  Are of Northern European descent.  Are 69-64 years old.  Work as a Airline pilot.  Work in an environment where you are exposed to metals, chemicals, mold or mildew, or insecticides. What are the signs or symptoms? Some people with sarcoidosis have no symptoms. Others have very  mild symptoms. The symptoms usually depend on the organ that is affected. Sarcoidosis most often affects the lungs, which may include symptoms such as:  Chest pain.  Coughing.  Wheezing.  Shortness of breath. Other common symptoms include:  Night sweats.  Fever.  Weight loss.  Fatigue.  Swollen lymph nodes.  Joint pain. How is this diagnosed? Sarcoidosis may be diagnosed based on:  Your symptoms and medical history.  A physical exam.  Imaging tests to check for granulomas such as: ? Chest X-ray. ? CT scan. ? MRI. ? PET scan.  Lung function tests. These tests evaluate your breathing and check for problems that may be related to sarcoidosis.  A procedure to remove a tissue sample for testing (biopsy). You may have a biopsy of lung tissue if that is where you are having symptoms. You may have tests to check for any complications of the condition. These tests may include:  Eye exams.  MRI of the heart or brain.  Echocardiogram.  Electrocardiogram (EKG or ECG). How is this treated? In some cases, sarcoidosis does not require a specific treatment because it causes no symptoms or mild symptoms. If your symptoms bother you or are severe, you may be prescribed medicines to reduce inflammation or relieve symptoms. These medicines may include:  Prednisone. This is a steroid that reduces inflammation related to sarcoidosis.  Hydroxychloroquine. This may be used to treat sarcoidosis that affects the skin, eyes, or brain.  Methotrexate, leflunomide, or azathioprine. These medicines affect the immune system and can help with sarcoidosis in the joints, eyes, skin, or lungs.  Medicines that you breathe in (inhalers). Inhalers can help you breathe if sarcoidosis affects your lungs. Follow these instructions at home:   Do not use any products that contain nicotine or tobacco, such as cigarettes and e-cigarettes. If you need help quitting, ask your health care  provider.  Avoid secondhand smoke and irritating dust or chemicals. Stay indoors on days when air quality is poor in your area.  Return to your normal activities as told by your health care provider. Ask your health care provider what activities are safe for you.  Take or use over-the-counter and prescription medicines only as told by your health care provider.  Keep all follow-up visits as told by your health care provider. This is important. Contact a health care provider if:  You have vision problems.  You have a dry cough that does not go away.  You have an irregular heartbeat.  You have pain or aches in your joints, hands, or feet.  You have an unexplained rash. Get help right away if:  You have chest pain.  You have difficulty breathing. Summary  Sarcoidosis is a disease that can cause inflammation in many body areas of the body. It most often affects the lungs (pulmonary sarcoidosis). It can also affect  the lymph nodes, liver, eyes, skin, heart, or any other body tissue.  When you have sarcoidosis, cells from your immune system form small clumps of tissue (granulomas) in the affected area of your body.  Sarcoidosis sometimes does not require a specific treatment because it causes no symptoms or mild symptoms.  If your symptoms bother you or are severe, you may be prescribed medicines to reduce inflammation or relieve symptoms. This information is not intended to replace advice given to you by your health care provider. Make sure you discuss any questions you have with your health care provider. Document Released: 04/15/2004 Document Revised: 05/28/2017 Document Reviewed: 03/23/2017 Elsevier Patient Education  2020 [employer].

## 2019-06-26 NOTE — Progress Notes (Signed)
Date:  06/26/2019   Name:  Sarah Mcgrath   DOB:  11-28-1970   MRN:  [id number]   Chief Complaint: Hypertension and Hyperlipidemia  Hypertension This is a chronic problem. The current episode started more than 1 year ago. The problem has been gradually improving since onset. The problem is controlled. Pertinent negatives include no anxiety, blurred vision, chest pain, headaches, malaise/fatigue, neck pain, orthopnea, palpitations, peripheral edema, PND, shortness of breath or sweats. There are no associated agents to hypertension. There are no known risk factors for coronary artery disease. Past treatments include calcium channel blockers and diuretics. The current treatment provides moderate improvement. There are no compliance problems.  There is no history of angina, kidney disease, CAD/MI, CVA, heart failure, left ventricular hypertrophy, PVD or retinopathy. There is no history of chronic renal disease, a hypertension causing med or renovascular disease.  Hyperlipidemia This is a chronic problem. The problem is controlled. Recent lipid tests were reviewed and are normal. She has no history of chronic renal disease, diabetes, hypothyroidism, liver disease or obesity. There are no known factors aggravating her hyperlipidemia. Pertinent negatives include no chest pain, focal sensory loss, focal weakness, leg pain, myalgias or shortness of breath. Current antihyperlipidemic treatment includes statins. The current treatment provides moderate improvement of lipids. There are no compliance problems.  There are no known risk factors for coronary artery disease.    Lab Results  Component Value Date   CREATININE 0.80 12/20/2018   BUN 13 12/20/2018   NA 139 12/20/2018   K 3.8 12/20/2018   CL 101 12/20/2018   CO2 24 12/20/2018   Lab Results  Component Value Date   CHOL 225 (H) 02/13/2019   HDL 64 02/13/2019   LDLCALC 130 (H) 02/13/2019   TRIG 153 (H) 02/13/2019   CHOLHDL 3.9  12/20/2018   Lab Results  Component Value Date   TSH 1.310 01/03/2015   No results found for: HGBA1C   Review of Systems  Constitutional: Negative for chills, fever and malaise/fatigue.  HENT: Negative for drooling, ear discharge, ear pain and sore throat.   Eyes: Negative for blurred vision and redness.  Respiratory: Negative for cough, shortness of breath and wheezing.   Cardiovascular: Negative for chest pain, palpitations, orthopnea, leg swelling and PND.  Gastrointestinal: Negative for abdominal pain, blood in stool, constipation, diarrhea and nausea.  Endocrine: Negative for polydipsia, polyphagia and polyuria.  Genitourinary: Negative for dysuria, frequency, hematuria and urgency.  Musculoskeletal: Negative for back pain, joint swelling, myalgias and neck pain.  Skin: Negative for rash and wound.  Allergic/Immunologic: Positive for environmental allergies. Negative for food allergies.  Neurological: Negative for dizziness, focal weakness and headaches.  Hematological: Does not bruise/bleed easily.  Psychiatric/Behavioral: Negative for suicidal ideas. The patient is not nervous/anxious.     Patient Active Problem List   Diagnosis Date Noted  . Essential hypertension 11/06/2014  . HLD (hyperlipidemia) 11/06/2014  . Drug noncompliance 11/06/2014  . Screening for depression 11/06/2014    No Known Allergies  Past Surgical History:  Procedure Laterality Date  . fibroid      Social History   Tobacco Use  . Smoking status: Never Smoker  . Smokeless tobacco: Never Used  Substance Use Topics  . Alcohol use: Yes    Alcohol/week: 0.0 standard drinks  . Drug use: No     Medication list has been reviewed and updated.  Current Meds  Medication Sig  . amLODipine (NORVASC) 5 MG tablet Take 1 tablet (5 mg  total) by mouth daily.  Marland Kitchen atorvastatin (LIPITOR) 10 MG tablet TAKE 1 TABLET BY MOUTH EVERY DAY  . hydrochlorothiazide (HYDRODIURIL) 25 MG tablet Take 1 tablet (25 mg  total) by mouth daily.    PHQ 2/9 Scores 06/26/2019 12/20/2018 06/17/2018 10/15/2017  PHQ - 2 Score 0 0 0 0  PHQ- 9 Score 0 0 0 0    BP Readings from Last 3 Encounters:  06/26/19 102/70  12/20/18 130/70  06/17/18 120/70    Physical Exam Vitals and nursing note reviewed.  HENT:     Head: Normocephalic.     Right Ear: Tympanic membrane, ear canal and external ear normal. There is no impacted cerumen.     Left Ear: Tympanic membrane, ear canal and external ear normal. There is no impacted cerumen.     Nose: Nose normal. No congestion or rhinorrhea.     Mouth/Throat:     Mouth: Mucous membranes are moist.  Eyes:     General: No scleral icterus.       Right eye: No discharge.        Left eye: No discharge.     Conjunctiva/sclera: Conjunctivae normal.     Pupils: Pupils are equal, round, and reactive to light.  Neck:     Thyroid: No thyromegaly.     Vascular: No JVD.     Trachea: No tracheal deviation.  Cardiovascular:     Rate and Rhythm: Normal rate and regular rhythm.     Heart sounds: Normal heart sounds. No murmur. No friction rub. No gallop.   Pulmonary:     Effort: No respiratory distress.     Breath sounds: Normal breath sounds. No wheezing, rhonchi or rales.  Chest:     Chest wall: No tenderness.  Abdominal:     General: Bowel sounds are normal.     Palpations: Abdomen is soft. There is no mass.     Tenderness: There is no abdominal tenderness. There is no guarding or rebound.  Musculoskeletal:        General: No tenderness. Normal range of motion.     Cervical back: Normal range of motion and neck supple.  Lymphadenopathy:     Cervical: No cervical adenopathy.  Skin:    General: Skin is warm.     Findings: No rash.  Neurological:     Mental Status: She is alert and oriented to person, place, and time.     Cranial Nerves: No cranial nerve deficit.     Deep Tendon Reflexes: Reflexes are normal and symmetric.     Wt Readings from Last 3 Encounters:    06/26/19 155 lb (70.3 kg)  12/20/18 155 lb (70.3 kg)  06/17/18 153 lb (69.4 kg)    BP 102/70   Pulse 80   Ht 5\' 2"  (1.575 m)   Wt 155 lb (70.3 kg)   BMI 28.35 kg/m   Assessment and Plan:  1. Essential hypertension Chronic.  Controlled.  Stable.  Continue amlodipine 5 mg once a day and hydrochlorothiazide 25 mg once a day.  Will check renal function panel. - amLODipine (NORVASC) 5 MG tablet; Take 1 tablet (5 mg total) by mouth daily.  Dispense: 90 tablet; Refill: 1 - hydrochlorothiazide (HYDRODIURIL) 25 MG tablet; Take 1 tablet (25 mg total) by mouth daily.  Dispense: 90 tablet; Refill: 1 - Renal Function Panel  2. Familial hypercholesterolemia Chronic.  Controlled.  Stable.  Continue atorvastatin 10 mg once a day.  Will check lipid panel with ratio.  Patient  has been encouraged to eat a Mediterranean diet. - atorvastatin (LIPITOR) 10 MG tablet; Take 1 tablet (10 mg total) by mouth daily.  Dispense: 90 tablet; Refill: 1 - Lipid Panel With LDL/HDL Ratio  3. Taking medication for chronic disease Patient is currently taking a statin agent and we will check hepatic function panel for evaluation of possible hepatic involvement. - Hepatic function panel  4. Sarcoidosis Chronic.  New problem.  Stable.  Patient's had any enlarged hilum that is been followed on multiple chest x-rays.  Patient had a rash that was biopsied times 2 x 2 different dermatologist which is come back as sarcoidosis.  This may be the reason for her enlarged hilum and we will recheck a chest x-ray with this in mind and bring it to the attention of the radiologist to see if this is consistent with her current sarcoidosis. - DG Chest 2 View; Future

## 2019-06-27 LAB — HEPATIC FUNCTION PANEL
ALT: 31 IU/L (ref 0–32)
AST: 27 IU/L (ref 0–40)
Alkaline Phosphatase: 73 IU/L (ref 39–117)
Bilirubin Total: 0.7 mg/dL (ref 0.0–1.2)
Bilirubin, Direct: 0.18 mg/dL (ref 0.00–0.40)
Total Protein: 7.1 g/dL (ref 6.0–8.5)

## 2019-06-27 LAB — RENAL FUNCTION PANEL
Albumin: 4.5 g/dL (ref 3.8–4.8)
BUN/Creatinine Ratio: 16 (ref 9–23)
BUN: 15 mg/dL (ref 6–24)
CO2: 24 mmol/L (ref 20–29)
Calcium: 10.2 mg/dL (ref 8.7–10.2)
Chloride: 101 mmol/L (ref 96–106)
Creatinine, Ser: 0.91 mg/dL (ref 0.57–1.00)
GFR calc Af Amer: 86 mL/min/{1.73_m2} (ref >59)
GFR calc non Af Amer: 74 mL/min/{1.73_m2} (ref >59)
Glucose: 102 mg/dL — ABNORMAL HIGH (ref 65–99)
Phosphorus: 3.5 mg/dL (ref 3.0–4.3)
Potassium: 3.5 mmol/L (ref 3.5–5.2)
Sodium: 140 mmol/L (ref 134–144)

## 2019-06-27 LAB — LIPID PANEL WITH LDL/HDL RATIO
Cholesterol, Total: 182 mg/dL (ref 100–199)
HDL: 64 mg/dL (ref >39)
LDL Chol Calc (NIH): 100 mg/dL — ABNORMAL HIGH (ref 0–99)
LDL/HDL Ratio: 1.6 ratio (ref 0.0–3.2)
Triglycerides: 103 mg/dL (ref 0–149)
VLDL Cholesterol Cal: 18 mg/dL (ref 5–40)

## 2019-08-04 ENCOUNTER — Other Ambulatory Visit: Payer: Self-pay | Admitting: Family Medicine

## 2019-08-04 DIAGNOSIS — I10 Essential (primary) hypertension: Secondary | ICD-10-CM

## 2019-10-27 ENCOUNTER — Other Ambulatory Visit: Payer: Self-pay | Admitting: Family Medicine

## 2019-10-27 DIAGNOSIS — I10 Essential (primary) hypertension: Secondary | ICD-10-CM

## 2019-12-16 ENCOUNTER — Other Ambulatory Visit: Payer: Self-pay | Admitting: Family Medicine

## 2019-12-16 DIAGNOSIS — I10 Essential (primary) hypertension: Secondary | ICD-10-CM

## 2019-12-16 NOTE — Telephone Encounter (Signed)
Requested Prescriptions  Pending Prescriptions Disp Refills  . hydrochlorothiazide (HYDRODIURIL) 25 MG tablet [Pharmacy Med Name: HYDROCHLOROTHIAZIDE 25 MG TAB] 90 tablet 0    Sig: TAKE 1 TABLET BY MOUTH EVERY DAY     Cardiovascular: Diuretics - Thiazide Passed - 12/16/2019  9:15 AM      Passed - Ca in normal range and within 360 days    Calcium  Date Value Ref Range Status  06/26/2019 10.2 8.7 - 10.2 mg/dL Final         Passed - Cr in normal range and within 360 days    Creatinine, Ser  Date Value Ref Range Status  06/26/2019 0.91 0.57 - 1.00 mg/dL Final         Passed - K in normal range and within 360 days    Potassium  Date Value Ref Range Status  06/26/2019 3.5 3.5 - 5.2 mmol/L Final         Passed - Na in normal range and within 360 days    Sodium  Date Value Ref Range Status  06/26/2019 140 134 - 144 mmol/L Final         Passed - Last BP in normal range    BP Readings from Last 1 Encounters:  06/26/19 102/70         Passed - Valid encounter within last 6 months    Recent Outpatient Visits          5 months ago Essential hypertension   [place], Deanna C, MD   12 months ago Essential hypertension   [place], Deanna C, MD   1 year ago Essential hypertension   [place], Deanna C, MD   2 years ago Essential hypertension   [place], Deanna C, MD   2 years ago Pure hypercholesterolemia   [place], Deanna C, MD      Future Appointments            In 2 days Juline Patch, MD [facility], [facility]

## 2019-12-18 ENCOUNTER — Ambulatory Visit: Payer: BC Managed Care – PPO | Admitting: Family Medicine

## 2019-12-18 ENCOUNTER — Encounter: Payer: Self-pay | Admitting: Family Medicine

## 2019-12-18 ENCOUNTER — Other Ambulatory Visit: Payer: Self-pay

## 2019-12-18 VITALS — BP 118/88 | HR 65 | Ht 62.0 in | Wt 152.0 lb

## 2019-12-18 DIAGNOSIS — D863 Sarcoidosis of skin: Secondary | ICD-10-CM | POA: Diagnosis not present

## 2019-12-18 DIAGNOSIS — E7801 Familial hypercholesterolemia: Secondary | ICD-10-CM

## 2019-12-18 DIAGNOSIS — I10 Essential (primary) hypertension: Secondary | ICD-10-CM | POA: Diagnosis not present

## 2019-12-18 MED ORDER — ATORVASTATIN CALCIUM 10 MG PO TABS: 10.0000 mg | ORAL_TABLET | Freq: Every day | ORAL | 1 refills | Status: DC

## 2019-12-18 MED ORDER — AMLODIPINE BESYLATE 5 MG PO TABS: 5.0000 mg | ORAL_TABLET | Freq: Every day | ORAL | 1 refills | Status: DC

## 2019-12-18 MED ORDER — HYDROCHLOROTHIAZIDE 25 MG PO TABS: 25.0000 mg | ORAL_TABLET | Freq: Every day | ORAL | 1 refills | Status: DC

## 2019-12-18 NOTE — Progress Notes (Signed)
Date:  12/18/2019   Name:  Sarah Mcgrath   DOB:  31-May-1971   MRN:  [id number]   Chief Complaint: Hyperlipidemia (follow up ) and Hypertension  Hyperlipidemia This is a chronic problem. The current episode started more than 1 year ago. The problem is controlled. Recent lipid tests were reviewed and are normal. She has no history of chronic renal disease, diabetes, hypothyroidism, liver disease, obesity or nephrotic syndrome. There are no known factors aggravating her hyperlipidemia. Pertinent negatives include no chest pain, focal sensory loss, focal weakness, leg pain, myalgias or shortness of breath. Current antihyperlipidemic treatment includes statins. The current treatment provides moderate improvement of lipids. There are no compliance problems.  Risk factors for coronary artery disease include hypertension.  Hypertension This is a chronic problem. The current episode started more than 1 year ago. The problem has been gradually improving since onset. The problem is controlled. Associated symptoms include sweats. Pertinent negatives include no anxiety, blurred vision, chest pain, headaches, malaise/fatigue, neck pain, orthopnea, palpitations, peripheral edema, PND or shortness of breath. There are no associated agents to hypertension. Risk factors for coronary artery disease include dyslipidemia. Past treatments include calcium channel blockers and diuretics. The current treatment provides moderate improvement. There are no compliance problems.  There is no history of angina, kidney disease, CAD/MI, CVA, heart failure, left ventricular hypertrophy, PVD or retinopathy. There is no history of chronic renal disease, a hypertension causing med or renovascular disease.    Lab Results  Component Value Date   CREATININE 0.91 06/26/2019   BUN 15 06/26/2019   NA 140 06/26/2019   K 3.5 06/26/2019   CL 101 06/26/2019   CO2 24 06/26/2019   Lab Results  Component Value Date   CHOL 182  06/26/2019   HDL 64 06/26/2019   LDLCALC 100 (H) 06/26/2019   TRIG 103 06/26/2019   CHOLHDL 3.9 12/20/2018   Lab Results  Component Value Date   TSH 1.310 01/03/2015   No results found for: HGBA1C Lab Results  Component Value Date   WBC 5.6 03/04/2017   HGB 12.8 03/04/2017   HCT 37.6 03/04/2017   MCV 80 03/04/2017   PLT 339 03/04/2017   Lab Results  Component Value Date   ALT 31 06/26/2019   AST 27 06/26/2019   ALKPHOS 73 06/26/2019   BILITOT 0.7 06/26/2019     Review of Systems  Constitutional: Negative.  Negative for chills, fatigue, fever, malaise/fatigue and unexpected weight change.  HENT: Negative for congestion, ear discharge, ear pain, rhinorrhea, sinus pressure, sneezing and sore throat.   Eyes: Negative for blurred vision, photophobia, pain, discharge, redness and itching.  Respiratory: Negative for cough, shortness of breath, wheezing and stridor.   Cardiovascular: Negative for chest pain, palpitations, orthopnea and PND.  Gastrointestinal: Negative for abdominal pain, blood in stool, constipation, diarrhea, nausea and vomiting.  Endocrine: Negative for cold intolerance, heat intolerance, polydipsia, polyphagia and polyuria.  Genitourinary: Negative for dysuria, flank pain, frequency, hematuria and urgency.  Musculoskeletal: Negative for arthralgias, back pain, myalgias and neck pain.  Skin: Negative for rash.  Allergic/Immunologic: Negative for environmental allergies and food allergies.  Neurological: Negative for dizziness, focal weakness, weakness, light-headedness, numbness and headaches.  Hematological: Negative for adenopathy. Does not bruise/bleed easily.  Psychiatric/Behavioral: Negative for dysphoric mood. The patient is not nervous/anxious.     Patient Active Problem List   Diagnosis Date Noted  . Essential hypertension 11/06/2014  . HLD (hyperlipidemia) 11/06/2014  . Drug noncompliance 11/06/2014  .  Screening for depression 11/06/2014    No  Known Allergies  Past Surgical History:  Procedure Laterality Date  . fibroid      Social History   Tobacco Use  . Smoking status: Never Smoker  . Smokeless tobacco: Never Used  Substance Use Topics  . Alcohol use: Yes    Alcohol/week: 0.0 standard drinks  . Drug use: No     Medication list has been reviewed and updated.  Current Meds  Medication Sig  . amLODipine (NORVASC) 5 MG tablet TAKE 1 TABLET BY MOUTH EVERY DAY  . atorvastatin (LIPITOR) 10 MG tablet Take 1 tablet (10 mg total) by mouth daily.  . hydrochlorothiazide (HYDRODIURIL) 25 MG tablet TAKE 1 TABLET BY MOUTH EVERY DAY    PHQ 2/9 Scores 12/18/2019 06/26/2019 12/20/2018 06/17/2018  PHQ - 2 Score 0 0 0 0  PHQ- 9 Score 0 0 0 0    GAD 7 : Generalized Anxiety Score 12/18/2019  Nervous, Anxious, on Edge 0  Control/stop worrying 0  Worry too much - different things 0  Trouble relaxing 0  Restless 0  Easily annoyed or irritable 0  Afraid - awful might happen 0  Total GAD 7 Score 0  Anxiety Difficulty Not difficult at all    BP Readings from Last 3 Encounters:  12/18/19 118/88  06/26/19 102/70  12/20/18 130/70    Physical Exam Vitals and nursing note reviewed.  HENT:     Head: Normocephalic.     Right Ear: Tympanic membrane, ear canal and external ear normal.     Left Ear: Tympanic membrane, ear canal and external ear normal.     Nose: Nose normal.  Eyes:     General: No scleral icterus.       Right eye: No discharge.        Left eye: No discharge.     Conjunctiva/sclera: Conjunctivae normal.     Pupils: Pupils are equal, round, and reactive to light.  Neck:     Thyroid: No thyromegaly.     Vascular: No JVD.     Trachea: No tracheal deviation.  Cardiovascular:     Rate and Rhythm: Normal rate and regular rhythm.     Heart sounds: Normal heart sounds, S1 normal and S2 normal. No murmur heard.  No systolic murmur is present.  No diastolic murmur is present.  No friction rub. No gallop. No S3 or  S4 sounds.   Pulmonary:     Effort: No respiratory distress.     Breath sounds: Normal breath sounds. No wheezing or rales.  Abdominal:     General: Bowel sounds are normal.     Palpations: Abdomen is soft. There is no mass.     Tenderness: There is no abdominal tenderness. There is no guarding or rebound.  Musculoskeletal:        General: No tenderness. Normal range of motion.     Cervical back: Normal range of motion and neck supple.  Lymphadenopathy:     Cervical: No cervical adenopathy.  Skin:    General: Skin is warm.     Findings: No rash.  Neurological:     Mental Status: She is alert and oriented to person, place, and time.     Cranial Nerves: No cranial nerve deficit.     Deep Tendon Reflexes: Reflexes are normal and symmetric.     Wt Readings from Last 3 Encounters:  12/18/19 152 lb (68.9 kg)  06/26/19 155 lb (70.3 kg)  12/20/18 155 lb (  70.3 kg)    BP 118/88   Pulse 65   Ht 5\' 2"  (1.575 m)   Wt 152 lb (68.9 kg)   SpO2 97%   BMI 27.80 kg/m   Assessment and Plan:  1. Essential hypertension Chronic.  Controlled.  Stable.  Blood pressure 118/88.  Continue combination of amlodipine 5 mg once a day and hydrochlorothiazide once a day.  Review of previous labs from last December were within reasonable range.  We will recheck in 6 months. - amLODipine (NORVASC) 5 MG tablet; Take 1 tablet (5 mg total) by mouth daily.  Dispense: 90 tablet; Refill: 1 - hydrochlorothiazide (HYDRODIURIL) 25 MG tablet; Take 1 tablet (25 mg total) by mouth daily.  Dispense: 90 tablet; Refill: 1  2. Familial hypercholesterolemia Chronic.  Controlled.  Stable.  Review of previous lipid panel is unremarkable.  We will continue on atorvastatin 10 mg once a day. - atorvastatin (LIPITOR) 10 MG tablet; Take 1 tablet (10 mg total) by mouth daily.  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  Review of last chest x-ray was  unremarkable for any disease.  Patient is not requiring any topical treatment at this time and will continue to observe area since there is no further spread.

## 2019-12-28 ENCOUNTER — Ambulatory Visit: Payer: Self-pay

## 2019-12-28 NOTE — Telephone Encounter (Signed)
Patient called stating that she has had swelling in her feet and ankles.  She states it is mostly in the left.  She describes it as mild swelling no pain.  Se was concerned that it could be from her Amlodipine. She states she has just been in for a check up with Dr Ronnald Ramp but forgot to tell her this happens sometimes. She state that she also sits a lot which could also be causing the intermittent swelling. Patient was advised to try to move as much as possible and when sitting for any period of time to try to pump her feet back and forth to help circulation. Patient states she does not wear shoe or socks this time of year. Note will be routed to office for Dr Ronnald Ramp to review. Answer Assessment - Initial Assessment Questions 1. LOCATION: "Which ankle is swollen?" "Where is the swelling?"     Mostly left ankle 2. ONSET: "When did the swelling start?"     Last year 3. SIZE: "How large is the swelling?"    Still see ankle bone 4. PAIN: "Is there any pain?" If Yes, ask: "How bad is it?" (Scale 1-10; or mild, moderate, severe)   - NONE (0): no pain.   - MILD (1-3): doesn't interfere with normal activities.    - MODERATE (4-7): interferes with normal activities (e.g., work or school) or awakens from sleep, limping.    - SEVERE (8-10): excruciating pain, unable to do any normal activities, unable to walk.     none 5. CAUSE: "What do you think caused the ankle swelling?"     Amlodipine and sitting a lot 6. OTHER SYMPTOMS: "Do you have any other symptoms?" (e.g., fever, chest pain, difficulty breathing, calf pain)    no 7. PREGNANCY: "Is there any chance you are pregnant?" "When was your last menstrual period?"    No menopaus  Protocols used: ANKLE SWELLING-A-AH

## 2019-12-28 NOTE — Telephone Encounter (Signed)
Attempted to contact patient who called to say she has swelling in her feet from Amlodipine. Left VM to return call to office.

## 2019-12-29 ENCOUNTER — Encounter: Payer: Self-pay | Admitting: Family Medicine

## 2019-12-29 ENCOUNTER — Ambulatory Visit: Payer: BC Managed Care – PPO | Admitting: Family Medicine

## 2019-12-29 ENCOUNTER — Other Ambulatory Visit: Payer: Self-pay

## 2019-12-29 VITALS — BP 120/88 | HR 80 | Ht 62.0 in | Wt 153.0 lb

## 2019-12-29 DIAGNOSIS — I872 Venous insufficiency (chronic) (peripheral): Secondary | ICD-10-CM | POA: Diagnosis not present

## 2019-12-29 NOTE — Progress Notes (Signed)
Date:  12/29/2019   Name:  Sarah Mcgrath   DOB:  02/18/1971   MRN:  [id number]   Chief Complaint: swelling in ankles (works from home sitting with feet down)  Patient is a 49 year old female who presents for a evening edema exam. The patient reports the following problems: edema. Health maintenance has been reviewed up to date.   Lab Results  Component Value Date   CREATININE 0.91 06/26/2019   BUN 15 06/26/2019   NA 140 06/26/2019   K 3.5 06/26/2019   CL 101 06/26/2019   CO2 24 06/26/2019   Lab Results  Component Value Date   CHOL 182 06/26/2019   HDL 64 06/26/2019   LDLCALC 100 (H) 06/26/2019   TRIG 103 06/26/2019   CHOLHDL 3.9 12/20/2018   Lab Results  Component Value Date   TSH 1.310 01/03/2015   No results found for: HGBA1C Lab Results  Component Value Date   WBC 5.6 03/04/2017   HGB 12.8 03/04/2017   HCT 37.6 03/04/2017   MCV 80 03/04/2017   PLT 339 03/04/2017   Lab Results  Component Value Date   ALT 31 06/26/2019   AST 27 06/26/2019   ALKPHOS 73 06/26/2019   BILITOT 0.7 06/26/2019     Review of Systems  Constitutional: Negative for chills and fever.  HENT: Negative for drooling, ear discharge, ear pain and sore throat.   Respiratory: Negative for cough, shortness of breath and wheezing.   Cardiovascular: Negative for chest pain, palpitations and leg swelling.  Gastrointestinal: Negative for abdominal pain, blood in stool, constipation, diarrhea and nausea.  Endocrine: Negative for polydipsia.  Genitourinary: Negative for dysuria, frequency, hematuria and urgency.  Musculoskeletal: Negative for back pain, myalgias and neck pain.  Skin: Negative for rash.  Allergic/Immunologic: Negative for environmental allergies.  Neurological: Negative for dizziness and headaches.  Hematological: Does not bruise/bleed easily.  Psychiatric/Behavioral: Negative for suicidal ideas. The patient is not nervous/anxious.     Patient Active Problem List    Diagnosis Date Noted  . 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 Essential hypertension 11/06/2014  . HLD (hyperlipidemia) 11/06/2014  . Drug noncompliance 11/06/2014  . Screening for depression 11/06/2014    No Known Allergies  Past Surgical History:  Procedure Laterality Date  . fibroid      Social History   Tobacco Use  . Smoking status: Never Smoker  . Smokeless tobacco: Never Used  Substance Use Topics  . Alcohol use: Yes    Alcohol/week: 0.0 standard drinks  . Drug use: No     Medication list has been reviewed and updated.  Current Meds  Medication Sig  . amLODipine (NORVASC) 5 MG tablet Take 1 tablet (5 mg total) by mouth daily.  Marland Kitchen atorvastatin (LIPITOR) 10 MG tablet Take 1 tablet (10 mg total) by mouth daily.  . hydrochlorothiazide (HYDRODIURIL) 25 MG tablet Take 1 tablet (25 mg total) by mouth daily.    PHQ 2/9 Scores 12/29/2019 12/18/2019 06/26/2019 12/20/2018  PHQ - 2 Score 0 0 0 0  PHQ- 9 Score 0 0 0 0    GAD 7 : Generalized Anxiety Score 12/29/2019 12/18/2019  Nervous, Anxious, on Edge 0 0  Control/stop worrying 0 0  Worry too much - different things 0 0  Trouble relaxing 0 0  Restless 0 0  Easily annoyed or irritable 0 0  Afraid - awful might happen 0 0  Total GAD 7 Score 0 0  Anxiety Difficulty - Not difficult at  all    BP Readings from Last 3 Encounters:  12/29/19 120/88  12/18/19 118/88  06/26/19 102/70    Physical Exam Vitals and nursing note reviewed.  HENT:     Head: Normocephalic.     Right Ear: Tympanic membrane, ear canal and external ear normal.     Left Ear: Tympanic membrane, ear canal and external ear normal.     Nose: Nose normal. No congestion or rhinorrhea.  Eyes:     General: No scleral icterus.       Right eye: No discharge.        Left eye: No discharge.     Conjunctiva/sclera: Conjunctivae normal.     Pupils: Pupils are equal, round, and reactive to light.  Neck:     Thyroid: No thyromegaly.     Vascular: No JVD.      Trachea: No tracheal deviation.  Cardiovascular:     Rate and Rhythm: Normal rate and regular rhythm.     Heart sounds: Normal heart sounds, S1 normal and S2 normal. No murmur heard.  No systolic murmur is present.  No diastolic murmur is present.  No friction rub. No gallop. No S3 or S4 sounds.   Pulmonary:     Effort: No respiratory distress.     Breath sounds: Normal breath sounds. No wheezing, rhonchi or rales.  Abdominal:     General: Bowel sounds are normal.     Palpations: Abdomen is soft. There is no mass.     Tenderness: There is no abdominal tenderness. There is no guarding or rebound.  Musculoskeletal:        General: No tenderness. Normal range of motion.     Cervical back: Normal range of motion and neck supple.     Right lower leg: No edema.     Left lower leg: No edema.  Lymphadenopathy:     Cervical: No cervical adenopathy.  Skin:    General: Skin is warm.     Capillary Refill: Capillary refill takes less than 2 seconds.     Findings: No bruising, erythema or rash.  Neurological:     Mental Status: She is alert and oriented to person, place, and time.     Cranial Nerves: No cranial nerve deficit.     Deep Tendon Reflexes: Reflexes are normal and symmetric.     Wt Readings from Last 3 Encounters:  12/29/19 153 lb (69.4 kg)  12/18/19 152 lb (68.9 kg)  06/26/19 155 lb (70.3 kg)    BP 120/88   Pulse 80   Ht 5\' 2"  (1.575 m)   Wt 153 lb (69.4 kg)   BMI 27.98 kg/m   Assessment and Plan:  1. Venous insufficiency of both lower extremities Although patient is on amlodipine she has been on the same dose for several years at least 2 with no edema in the past.  Her history is consistent with venous insufficiency and that she has her legs in a sitting position for the most part of the day because she works at home is seem to go down over the night when she is lying supine as she is sleeping.  We have discussed venous insufficiency in the portance to get up and move  around and to get the pumping action of the calves as well areas in the future we may have to have some compression stockings to help with the swelling.  This point in time we will continue present dosing of her medication and if swelling does  continue or should increase patient is to call back and we will further evaluate and likely change her medication.

## 2019-12-29 NOTE — Telephone Encounter (Signed)
Tell her to come in this afternoon or morning

## 2019-12-29 NOTE — Patient Instructions (Signed)

## 2020-01-23 ENCOUNTER — Other Ambulatory Visit: Payer: Self-pay | Admitting: Family Medicine

## 2020-01-23 DIAGNOSIS — I10 Essential (primary) hypertension: Secondary | ICD-10-CM

## 2020-01-23 NOTE — Telephone Encounter (Signed)
Requested Prescriptions  Pending Prescriptions Disp Refills  . amLODipine (NORVASC) 5 MG tablet [Pharmacy Med Name: AMLODIPINE BESYLATE 5 MG TAB] 90 tablet 1    Sig: TAKE 1 TABLET BY MOUTH EVERY DAY     Cardiovascular:  Calcium Channel Blockers Passed - 01/23/2020  1:26 AM      Passed - Last BP in normal range    BP Readings from Last 1 Encounters:  12/29/19 120/88         Passed - Valid encounter within last 6 months    Recent Outpatient Visits          3 weeks ago Venous insufficiency of both lower extremities   [facility] Juline Patch, MD   1 month ago Essential hypertension   [place], [place]   7 months ago Essential hypertension   [place], Deanna C, MD   1 year ago Essential hypertension   [place], Deanna C, MD   1 year ago Essential hypertension   [place], Deanna C, MD      Future Appointments            In 4 months Juline Patch, MD [facility], [facility]

## 2020-04-10 ENCOUNTER — Other Ambulatory Visit: Payer: Self-pay | Admitting: Family Medicine

## 2020-04-10 DIAGNOSIS — Z1231 Encounter for screening mammogram for malignant neoplasm of breast: Secondary | ICD-10-CM

## 2020-05-10 ENCOUNTER — Other Ambulatory Visit: Payer: Self-pay

## 2020-05-10 ENCOUNTER — Ambulatory Visit
Admission: RE | Admit: 2020-05-10 | Discharge: 2020-05-10 | Disposition: A | Discharge status: Home / Self Care | Payer: BC Managed Care – PPO | Source: Ambulatory Visit | Attending: Family Medicine | Admitting: Family Medicine

## 2020-05-10 DIAGNOSIS — Z1231 Encounter for screening mammogram for malignant neoplasm of breast: Secondary | ICD-10-CM | POA: Diagnosis not present

## 2020-06-04 ENCOUNTER — Encounter: Payer: Self-pay | Admitting: Family Medicine

## 2020-06-04 ENCOUNTER — Ambulatory Visit: Payer: BC Managed Care – PPO | Admitting: Family Medicine

## 2020-06-04 ENCOUNTER — Other Ambulatory Visit: Payer: Self-pay

## 2020-06-04 VITALS — BP 120/88 | HR 80 | Ht 63.0 in | Wt 151.0 lb

## 2020-06-04 DIAGNOSIS — E7801 Familial hypercholesterolemia: Secondary | ICD-10-CM

## 2020-06-04 DIAGNOSIS — Z1211 Encounter for screening for malignant neoplasm of colon: Secondary | ICD-10-CM

## 2020-06-04 DIAGNOSIS — I10 Essential (primary) hypertension: Secondary | ICD-10-CM | POA: Diagnosis not present

## 2020-06-04 MED ORDER — ATORVASTATIN CALCIUM 10 MG PO TABS: 10.0000 mg | ORAL_TABLET | Freq: Every day | ORAL | 1 refills | Status: DC

## 2020-06-04 MED ORDER — HYDROCHLOROTHIAZIDE 25 MG PO TABS: 25.0000 mg | ORAL_TABLET | Freq: Every day | ORAL | 1 refills | Status: DC

## 2020-06-04 MED ORDER — AMLODIPINE BESYLATE 5 MG PO TABS: 5.0000 mg | ORAL_TABLET | Freq: Every day | ORAL | 1 refills | Status: DC

## 2020-06-04 NOTE — Progress Notes (Signed)
Date:  06/04/2020   Name:  Sarah Mcgrath   DOB:  08/14/1970   MRN:  [id number]   Chief Complaint: Hyperlipidemia, Hypertension, and Colon Cancer Screening  Hyperlipidemia This is a chronic problem. The current episode started more than 1 year ago. The problem is controlled. Recent lipid tests were reviewed and are normal. She has no history of chronic renal disease, diabetes, hypothyroidism, liver disease, obesity or nephrotic syndrome. There are no known factors aggravating her hyperlipidemia. Pertinent negatives include no chest pain, focal sensory loss, focal weakness, leg pain, myalgias or shortness of breath. She is currently on no antihyperlipidemic treatment. The current treatment provides moderate improvement of lipids. There are no compliance problems.  There are no known risk factors for coronary artery disease.  Hypertension This is a chronic problem. The current episode started more than 1 year ago. The problem has been waxing and waning since onset. The problem is controlled. Pertinent negatives include no anxiety, blurred vision, chest pain, headaches, malaise/fatigue, neck pain, orthopnea, palpitations, peripheral edema, PND, shortness of breath or sweats. There are no associated agents to hypertension. Past treatments include calcium channel blockers and diuretics. The current treatment provides moderate improvement. There are no compliance problems.  There is no history of angina, kidney disease, CAD/MI, CVA, heart failure, left ventricular hypertrophy, PVD or retinopathy. There is no history of chronic renal disease, a hypertension causing med or renovascular disease.    Lab Results  Component Value Date   CREATININE 0.91 06/26/2019   BUN 15 06/26/2019   NA 140 06/26/2019   K 3.5 06/26/2019   CL 101 06/26/2019   CO2 24 06/26/2019   Lab Results  Component Value Date   CHOL 182 06/26/2019   HDL 64 06/26/2019   LDLCALC 100 (H) 06/26/2019   TRIG 103 06/26/2019    CHOLHDL 3.9 12/20/2018   Lab Results  Component Value Date   TSH 1.310 01/03/2015   No results found for: HGBA1C Lab Results  Component Value Date   WBC 5.6 03/04/2017   HGB 12.8 03/04/2017   HCT 37.6 03/04/2017   MCV 80 03/04/2017   PLT 339 03/04/2017   Lab Results  Component Value Date   ALT 31 06/26/2019   AST 27 06/26/2019   ALKPHOS 73 06/26/2019   BILITOT 0.7 06/26/2019     Review of Systems  Constitutional: Negative.  Negative for chills, fatigue, fever, malaise/fatigue and unexpected weight change.  HENT: Negative for congestion, ear discharge, ear pain, rhinorrhea, sinus pressure, sneezing and sore throat.   Eyes: Negative for blurred vision, photophobia, pain, discharge, redness and itching.  Respiratory: Negative for cough, shortness of breath, wheezing and stridor.   Cardiovascular: Negative for chest pain, palpitations, orthopnea and PND.  Gastrointestinal: Negative for abdominal pain, blood in stool, constipation, diarrhea, nausea and vomiting.  Endocrine: Negative for cold intolerance, heat intolerance, polydipsia, polyphagia and polyuria.  Genitourinary: Negative for dysuria, flank pain, frequency, hematuria and urgency.  Musculoskeletal: Negative for arthralgias, back pain, myalgias and neck pain.  Skin: Negative for rash.  Allergic/Immunologic: Negative for environmental allergies and food allergies.  Neurological: Negative for dizziness, focal weakness, weakness, light-headedness, numbness and headaches.  Hematological: Negative for adenopathy. Does not bruise/bleed easily.  Psychiatric/Behavioral: Negative for dysphoric mood. The patient is not nervous/anxious.     Patient Active Problem List   Diagnosis Date Noted  . 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 Essential hypertension 11/06/2014  . HLD (hyperlipidemia) 11/06/2014  . Drug noncompliance 11/06/2014  . Screening  for depression 11/06/2014    No Known Allergies  Past Surgical History:    Procedure Laterality Date  . fibroid      Social History   Tobacco Use  . Smoking status: Never Smoker  . Smokeless tobacco: Never Used  Substance Use Topics  . Alcohol use: Yes    Alcohol/week: 0.0 standard drinks  . Drug use: No     Medication list has been reviewed and updated.  Current Meds  Medication Sig  . amLODipine (NORVASC) 5 MG tablet TAKE 1 TABLET BY MOUTH EVERY DAY  . atorvastatin (LIPITOR) 10 MG tablet Take 1 tablet (10 mg total) by mouth daily.  . hydrochlorothiazide (HYDRODIURIL) 25 MG tablet Take 1 tablet (25 mg total) by mouth daily.    PHQ 2/9 Scores 12/29/2019 12/18/2019 06/26/2019 12/20/2018  PHQ - 2 Score 0 0 0 0  PHQ- 9 Score 0 0 0 0    GAD 7 : Generalized Anxiety Score 12/29/2019 12/18/2019  Nervous, Anxious, on Edge 0 0  Control/stop worrying 0 0  Worry too much - different things 0 0  Trouble relaxing 0 0  Restless 0 0  Easily annoyed or irritable 0 0  Afraid - awful might happen 0 0  Total GAD 7 Score 0 0  Anxiety Difficulty - Not difficult at all    BP Readings from Last 3 Encounters:  06/04/20 120/88  12/29/19 120/88  12/18/19 118/88    Physical Exam Vitals and nursing note reviewed.  HENT:     Head: Normocephalic.     Right Ear: Tympanic membrane, ear canal and external ear normal.     Left Ear: Tympanic membrane, ear canal and external ear normal.     Nose: Nose normal. No congestion or rhinorrhea.  Eyes:     General: No scleral icterus.       Right eye: No discharge.        Left eye: No discharge.     Conjunctiva/sclera: Conjunctivae normal.     Pupils: Pupils are equal, round, and reactive to light.  Neck:     Thyroid: No thyromegaly.     Vascular: No JVD.     Trachea: No tracheal deviation.  Cardiovascular:     Rate and Rhythm: Normal rate and regular rhythm.     Heart sounds: Normal heart sounds. No murmur heard.  No friction rub. No gallop.   Pulmonary:     Effort: No respiratory distress.     Breath sounds: Normal  breath sounds. No wheezing, rhonchi or rales.  Abdominal:     General: Bowel sounds are normal.     Palpations: Abdomen is soft. There is no mass.     Tenderness: There is no abdominal tenderness. There is no guarding or rebound.  Musculoskeletal:        General: No tenderness. Normal range of motion.     Cervical back: Normal range of motion and neck supple.  Lymphadenopathy:     Cervical: No cervical adenopathy.  Skin:    General: Skin is warm.     Capillary Refill: Capillary refill takes less than 2 seconds.     Findings: No rash.  Neurological:     Mental Status: She is alert and oriented to person, place, and time.     Cranial Nerves: No cranial nerve deficit.     Deep Tendon Reflexes: Reflexes are normal and symmetric.     Wt Readings from Last 3 Encounters:  06/04/20 151 lb (68.5 kg)  12/29/19  153 lb (69.4 kg)  12/18/19 152 lb (68.9 kg)    BP 120/88   Pulse 80   Ht 5\' 3"  (1.6 m)   Wt 151 lb (68.5 kg)   BMI 26.75 kg/m   Assessment and Plan: 1. Essential hypertension Chronic.  Controlled.  Stable.  Blood pressure 120/88.  Continue amlodipine 5 mg once a day and hydrochlorothiazide 25 mg once a day.  Will check CMP for electrolytes and GFR. - Comprehensive Metabolic Panel (CMET) - amLODipine (NORVASC) 5 MG tablet; Take 1 tablet (5 mg total) by mouth daily.  Dispense: 90 tablet; Refill: 1 - hydrochlorothiazide (HYDRODIURIL) 25 MG tablet; Take 1 tablet (25 mg total) by mouth daily.  Dispense: 90 tablet; Refill: 1  2. Familial hypercholesterolemia Chronic.  Controlled.  Stable.  Continue atorvastatin 10 mg once a day.  Will check lipid panel for current status. - Lipid Panel With LDL/HDL Ratio - atorvastatin (LIPITOR) 10 MG tablet; Take 1 tablet (10 mg total) by mouth daily.  Dispense: 90 tablet; Refill: 1  3. Colon cancer screening Discussed with patient and will make referral for gastroenterology for evaluation for colonoscopy screening. - Ambulatory referral to  Gastroenterology

## 2020-06-05 LAB — LIPID PANEL WITH LDL/HDL RATIO
Cholesterol, Total: 189 mg/dL (ref 100–199)
HDL: 72 mg/dL (ref >39)
LDL Chol Calc (NIH): 103 mg/dL — ABNORMAL HIGH (ref 0–99)
LDL/HDL Ratio: 1.4 ratio (ref 0.0–3.2)
Triglycerides: 74 mg/dL (ref 0–149)
VLDL Cholesterol Cal: 14 mg/dL (ref 5–40)

## 2020-06-05 LAB — COMPREHENSIVE METABOLIC PANEL
ALT: 33 IU/L — ABNORMAL HIGH (ref 0–32)
AST: 29 IU/L (ref 0–40)
Albumin/Globulin Ratio: 1.9 (ref 1.2–2.2)
Albumin: 4.7 g/dL (ref 3.8–4.8)
Alkaline Phosphatase: 79 IU/L (ref 44–121)
BUN/Creatinine Ratio: 15 (ref 9–23)
BUN: 12 mg/dL (ref 6–24)
Bilirubin Total: 0.7 mg/dL (ref 0.0–1.2)
CO2: 25 mmol/L (ref 20–29)
Calcium: 10 mg/dL (ref 8.7–10.2)
Chloride: 102 mmol/L (ref 96–106)
Creatinine, Ser: 0.78 mg/dL (ref 0.57–1.00)
GFR calc Af Amer: 103 mL/min/{1.73_m2} (ref >59)
GFR calc non Af Amer: 89 mL/min/{1.73_m2} (ref >59)
Globulin, Total: 2.5 g/dL (ref 1.5–4.5)
Glucose: 101 mg/dL — ABNORMAL HIGH (ref 65–99)
Potassium: 3.9 mmol/L (ref 3.5–5.2)
Sodium: 142 mmol/L (ref 134–144)
Total Protein: 7.2 g/dL (ref 6.0–8.5)

## 2020-06-07 ENCOUNTER — Telehealth (INDEPENDENT_AMBULATORY_CARE_PROVIDER_SITE_OTHER): Payer: Self-pay | Admitting: Gastroenterology

## 2020-06-07 ENCOUNTER — Other Ambulatory Visit: Payer: Self-pay

## 2020-06-07 DIAGNOSIS — Z1211 Encounter for screening for malignant neoplasm of colon: Secondary | ICD-10-CM

## 2020-06-07 MED ORDER — PEG 3350-KCL-NA BICARB-NACL 420 G PO SOLR: 4000.0000 mL | Freq: Once | ORAL | 0 refills | Status: AC

## 2020-06-07 NOTE — Progress Notes (Signed)
Gastroenterology Pre-Procedure Review  Request Date: Friday 07/05/20 Requesting Physician: Dr. Allen Norris  PATIENT REVIEW QUESTIONS: The patient responded to the following health history questions as indicated:    1. Are you having any GI issues? no 2. Do you have a personal history of Polyps? no 3. Do you have a family history of Colon Cancer or Polyps? no 4. Diabetes Mellitus? no 5. Joint replacements in the past 12 months?no 6. Major health problems in the past 3 months?no 7. Any artificial heart valves, MVP, or defibrillator?no    MEDICATIONS & ALLERGIES:    Patient reports the following regarding taking any anticoagulation/antiplatelet therapy:   Plavix, Coumadin, Eliquis, Xarelto, Lovenox, Pradaxa, Brilinta, or Effient? no Aspirin? no  Patient confirms/reports the following medications:  Current Outpatient Medications  Medication Sig Dispense Refill  . amLODipine (NORVASC) 5 MG tablet Take 1 tablet (5 mg total) by mouth daily. 90 tablet 1  . atorvastatin (LIPITOR) 10 MG tablet Take 1 tablet (10 mg total) by mouth daily. 90 tablet 1  . hydrochlorothiazide (HYDRODIURIL) 25 MG tablet Take 1 tablet (25 mg total) by mouth daily. 90 tablet 1   No current facility-administered medications for this visit.    Patient confirms/reports the following allergies:  No Known Allergies  No orders of the defined types were placed in this encounter.   AUTHORIZATION INFORMATION Primary Insurance: 1D#: Group #:  Secondary Insurance: 1D#: Group #:  SCHEDULE INFORMATION: Date: 07/05/20 Time: Location:[facility]

## 2020-06-24 ENCOUNTER — Encounter: Payer: Self-pay | Admitting: Gastroenterology

## 2020-07-03 ENCOUNTER — Other Ambulatory Visit
Admission: RE | Admit: 2020-07-03 | Discharge: 2020-07-03 | Disposition: A | Discharge status: Home / Self Care | Payer: BC Managed Care – PPO | Source: Ambulatory Visit | Attending: Gastroenterology | Admitting: Gastroenterology

## 2020-07-03 ENCOUNTER — Other Ambulatory Visit: Payer: Self-pay

## 2020-07-03 DIAGNOSIS — Z20822 Contact with and (suspected) exposure to covid-19: Secondary | ICD-10-CM | POA: Diagnosis not present

## 2020-07-03 DIAGNOSIS — Z1211 Encounter for screening for malignant neoplasm of colon: Secondary | ICD-10-CM | POA: Diagnosis present

## 2020-07-03 DIAGNOSIS — Z79899 Other long term (current) drug therapy: Secondary | ICD-10-CM | POA: Diagnosis not present

## 2020-07-03 DIAGNOSIS — Z01812 Encounter for preprocedural laboratory examination: Secondary | ICD-10-CM | POA: Insufficient documentation

## 2020-07-03 LAB — SARS CORONAVIRUS 2 (TAT 6-24 HRS): SARS Coronavirus 2: NEGATIVE

## 2020-07-04 NOTE — Discharge Instructions (Signed)
General Anesthesia, Adult, Care After This sheet gives you information about how to care for yourself after your procedure. Your health care provider may also give you more specific instructions. If you have problems or questions, contact your health care provider. What can I expect after the procedure? After the procedure, the following side effects are common:  Pain or discomfort at the IV site.  Nausea.  Vomiting.  Sore throat.  Trouble concentrating.  Feeling cold or chills.  Weak or tired.  Sleepiness and fatigue.  Soreness and body aches. These side effects can affect parts of the body that were not involved in surgery. Follow these instructions at home:  For at least 24 hours after the procedure:  Have a responsible adult stay with you. It is important to have someone help care for you until you are awake and alert.  Rest as needed.  Do not: ? Participate in activities in which you could fall or become injured. ? Drive. ? Use heavy machinery. ? Drink alcohol. ? Take sleeping pills or medicines that cause drowsiness. ? Make important decisions or sign legal documents. ? Take care of children on your own. Eating and drinking  Follow any instructions from your health care provider about eating or drinking restrictions.  When you feel hungry, start by eating small amounts of foods that are soft and easy to digest (bland), such as toast. Gradually return to your regular diet.  Drink enough fluid to keep your urine pale yellow.  If you vomit, rehydrate by drinking water, juice, or clear broth. General instructions  If you have sleep apnea, surgery and certain medicines can increase your risk for breathing problems. Follow instructions from your health care provider about wearing your sleep device: ? Anytime you are sleeping, including during daytime naps. ? While taking prescription pain medicines, sleeping medicines, or medicines that make you drowsy.  Return to  your normal activities as told by your health care provider. Ask your health care provider what activities are safe for you.  Take over-the-counter and prescription medicines only as told by your health care provider.  If you smoke, do not smoke without supervision.  Keep all follow-up visits as told by your health care provider. This is important. Contact a health care provider if:  You have nausea or vomiting that does not get better with medicine.  You cannot eat or drink without vomiting.  You have pain that does not get better with medicine.  You are unable to pass urine.  You develop a skin rash.  You have a fever.  You have redness around your IV site that gets worse. Get help right away if:  You have difficulty breathing.  You have chest pain.  You have blood in your urine or stool, or you vomit blood. Summary  After the procedure, it is common to have a sore throat or nausea. It is also common to feel tired.  Have a responsible adult stay with you for the first 24 hours after general anesthesia. It is important to have someone help care for you until you are awake and alert.  When you feel hungry, start by eating small amounts of foods that are soft and easy to digest (bland), such as toast. Gradually return to your regular diet.  Drink enough fluid to keep your urine pale yellow.  Return to your normal activities as told by your health care provider. Ask your health care provider what activities are safe for you. This information is not   intended to replace advice given to you by your health care provider. Make sure you discuss any questions you have with your health care provider. Document Revised: 06/18/2017 Document Reviewed: 01/29/2017 Elsevier Patient Education  2020 [employer]  

## 2020-07-05 ENCOUNTER — Encounter: Payer: Self-pay | Admitting: Gastroenterology

## 2020-07-05 ENCOUNTER — Ambulatory Visit: Payer: BC Managed Care – PPO | Admitting: Anesthesiology

## 2020-07-05 ENCOUNTER — Encounter
Admission: RE | Disposition: A | Discharge status: Home / Self Care | Payer: Self-pay | Source: Home / Self Care | Attending: Gastroenterology

## 2020-07-05 ENCOUNTER — Other Ambulatory Visit: Payer: Self-pay

## 2020-07-05 ENCOUNTER — Ambulatory Visit
Admission: RE | Admit: 2020-07-05 | Discharge: 2020-07-05 | Disposition: A | Discharge status: Home / Self Care | Payer: BC Managed Care – PPO | Attending: Gastroenterology | Admitting: Gastroenterology

## 2020-07-05 DIAGNOSIS — Z1211 Encounter for screening for malignant neoplasm of colon: Secondary | ICD-10-CM | POA: Diagnosis not present

## 2020-07-05 DIAGNOSIS — Z20822 Contact with and (suspected) exposure to covid-19: Secondary | ICD-10-CM | POA: Insufficient documentation

## 2020-07-05 DIAGNOSIS — Z79899 Other long term (current) drug therapy: Secondary | ICD-10-CM | POA: Insufficient documentation

## 2020-07-05 HISTORY — DX: Sarcoidosis, unspecified: D86.9

## 2020-07-05 HISTORY — PX: COLONOSCOPY WITH PROPOFOL: SHX5780

## 2020-07-05 HISTORY — DX: Hyperlipidemia, unspecified: E78.5

## 2020-07-05 SURGERY — COLONOSCOPY WITH PROPOFOL
Anesthesia: General

## 2020-07-05 MED ORDER — LACTATED RINGERS IV SOLN: INTRAVENOUS | Status: DC

## 2020-07-05 MED ORDER — PROPOFOL 10 MG/ML IV BOLUS
INTRAVENOUS | Status: DC | PRN
  Administered 2020-07-05 (×3): 40 mg via INTRAVENOUS
  Administered 2020-07-05: 200 mg via INTRAVENOUS
  Administered 2020-07-05 (×2): 40 mg via INTRAVENOUS

## 2020-07-05 MED ORDER — STERILE WATER FOR IRRIGATION IR SOLN: Status: DC | PRN

## 2020-07-05 MED ORDER — LIDOCAINE HCL (CARDIAC) PF 100 MG/5ML IV SOSY
PREFILLED_SYRINGE | INTRAVENOUS | Status: DC | PRN
  Administered 2020-07-05: 30 mg via INTRAVENOUS

## 2020-07-05 SURGICAL SUPPLY — 25 items
CLIP HMST 235XBRD CATH ROT (MISCELLANEOUS) IMPLANT
CLIP RESOLUTION 360 11X235 (MISCELLANEOUS)
ELECT REM PT RETURN 9FT ADLT (ELECTROSURGICAL)
ELECTRODE REM PT RTRN 9FT ADLT (ELECTROSURGICAL) IMPLANT
FCP ESCP3.2XJMB 240X2.8X (MISCELLANEOUS)
FORCEPS BIOP RAD 4 LRG CAP 4 (CUTTING FORCEPS) IMPLANT
FORCEPS BIOP RJ4 240 W/NDL (MISCELLANEOUS)
FORCEPS ESCP3.2XJMB 240X2.8X (MISCELLANEOUS) IMPLANT
GOWN CVR UNV OPN BCK APRN NK (MISCELLANEOUS) ×2 IMPLANT
GOWN ISOL THUMB LOOP REG UNIV (MISCELLANEOUS) ×4
INJECTOR VARIJECT VIN23 (MISCELLANEOUS) IMPLANT
KIT DEFENDO VALVE AND CONN (KITS) IMPLANT
KIT PRC NS LF DISP ENDO (KITS) ×1 IMPLANT
KIT PROCEDURE OLYMPUS (KITS) ×2
MANIFOLD NEPTUNE II (INSTRUMENTS) ×2 IMPLANT
MARKER SPOT ENDO TATTOO 5ML (MISCELLANEOUS) IMPLANT
PROBE APC STR FIRE (PROBE) IMPLANT
RETRIEVER NET ROTH 2.5X230 LF (MISCELLANEOUS) IMPLANT
SNARE SHORT THROW 13M SML OVAL (MISCELLANEOUS) IMPLANT
SNARE SHORT THROW 30M LRG OVAL (MISCELLANEOUS) IMPLANT
SNARE SNG USE RND 15MM (INSTRUMENTS) IMPLANT
SPOT EX ENDOSCOPIC TATTOO (MISCELLANEOUS)
TRAP ETRAP POLY (MISCELLANEOUS) IMPLANT
VARIJECT INJECTOR VIN23 (MISCELLANEOUS)
WATER STERILE IRR 250ML POUR (IV SOLUTION) ×2 IMPLANT

## 2020-07-05 NOTE — Anesthesia Preprocedure Evaluation (Signed)
Anesthesia Evaluation  Patient identified by MRN, date of birth, ID band Patient awake    Reviewed: Allergy & Precautions, H&P , NPO status , Patient's Chart, lab work & pertinent test results, reviewed documented beta blocker date and time   Airway Mallampati: II  TM Distance: >3 FB Neck ROM: full    Dental no notable dental hx.    Pulmonary  Sarcoidosis (cutaneous)   Pulmonary exam normal breath sounds clear to auscultation       Cardiovascular Exercise Tolerance: Good hypertension,  Rhythm:regular Rate:Normal     Neuro/Psych negative neurological ROS  negative psych ROS   GI/Hepatic negative GI ROS, Neg liver ROS,   Endo/Other  negative endocrine ROS  Renal/GU negative Renal ROS  negative genitourinary   Musculoskeletal   Abdominal   Peds  Hematology negative hematology ROS (+)   Anesthesia Other Findings   Reproductive/Obstetrics negative OB ROS                             Anesthesia Physical Anesthesia Plan  ASA: II  Anesthesia Plan: General   Post-op Pain Management:    Induction:   PONV Risk Score and Plan: 3 and TIVA, Propofol infusion and Treatment may vary due to age or medical condition  Airway Management Planned:   Additional Equipment:   Intra-op Plan:   Post-operative Plan:   Informed Consent: I have reviewed the patients History and Physical, chart, labs and discussed the procedure including the risks, benefits and alternatives for the proposed anesthesia with the patient or authorized representative who has indicated his/her understanding and acceptance.     Dental Advisory Given  Plan Discussed with: CRNA  Anesthesia Plan Comments:         Anesthesia Quick Evaluation

## 2020-07-05 NOTE — Anesthesia Postprocedure Evaluation (Signed)
Anesthesia Post Note  Patient: Sarah Mcgrath  Procedure(s) Performed: COLONOSCOPY WITH PROPOFOL (N/A )     Patient location during evaluation: PACU Anesthesia Type: General Level of consciousness: awake and alert Pain management: pain level controlled Vital Signs Assessment: post-procedure vital signs reviewed and stable Respiratory status: spontaneous breathing, nonlabored ventilation, respiratory function stable and patient connected to nasal cannula oxygen Cardiovascular status: blood pressure returned to baseline and stable Postop Assessment: no apparent nausea or vomiting Anesthetic complications: no   No complications documented.  Alisa Graff

## 2020-07-05 NOTE — H&P (Signed)
   Lucilla Lame, MD [facility] [address], [place], [place] [address] Phone: [phone] Fax : [phone]  Primary Care Physician:  Juline Patch, MD Primary Gastroenterologist:  Dr. Allen Norris  Pre-Procedure History & Physical: HPI:  Sarah Mcgrath is a 50 y.o. adult is here for a screening colonoscopy.   Past Medical History:  Diagnosis Date  . Fibroid uterus   . Hyperlipidemia   . Hypertension   . Hypertension   . Sarcoidosis     Past Surgical History:  Procedure Laterality Date  . fibroid      Prior to Admission medications   Medication Sig Start Date End Date Taking? Authorizing Provider  amLODipine (NORVASC) 5 MG tablet Take 1 tablet (5 mg total) by mouth daily. 06/04/20  Yes Juline Patch, MD  atorvastatin (LIPITOR) 10 MG tablet Take 1 tablet (10 mg total) by mouth daily. 06/04/20  Yes Juline Patch, MD  hydrochlorothiazide (HYDRODIURIL) 25 MG tablet Take 1 tablet (25 mg total) by mouth daily. 06/04/20  Yes Juline Patch, MD    Allergies as of 06/07/2020  . (No Known Allergies)    Family History  Problem Relation Age of Onset  . Diabetes Mother   . Heart disease Mother   . Hypertension Father   . Stroke Maternal Grandmother   . Stroke Maternal Grandfather   . Breast cancer Neg Hx     Social History   Socioeconomic History  . Marital status: Single    Spouse name: Not on file  . Number of children: Not on file  . Years of education: Not on file  . Highest education level: Not on file  Occupational History  . Not on file  Tobacco Use  . Smoking status: Never Smoker  . Smokeless tobacco: Never Used  Substance and Sexual Activity  . Alcohol use: Yes    Alcohol/week: 0.0 standard drinks  . Drug use: No  . Sexual activity: Yes  Other Topics Concern  . Not on file  Social History Narrative  . Not on file   Social Determinants of Health   Financial Resource Strain: Not on file  Food Insecurity: Not on file  Transportation Needs:  Not on file  Physical Activity: Not on file  Stress: Not on file  Social Connections: Not on file  Intimate Partner Violence: Not on file    Review of Systems: See HPI, otherwise negative ROS  Physical Exam: BP 110/77   Pulse (!) 55   Temp (!) 97 F (36.1 C) (Temporal)   Ht 5\' 3"  (1.6 m)   Wt 68.5 kg   LMP 03/29/2020   SpO2 100%   BMI 26.75 kg/m  General:   Alert,  pleasant and cooperative in NAD Head:  Normocephalic and atraumatic. Neck:  Supple; no masses or thyromegaly. Lungs:  Clear throughout to auscultation.    Heart:  Regular rate and rhythm. Abdomen:  Soft, nontender and nondistended. Normal bowel sounds, without guarding, and without rebound.   Neurologic:  Alert and  oriented x4;  grossly normal neurologically.  Impression/Plan: Sarah Mcgrath is now here to undergo a screening colonoscopy.  Risks, benefits, and alternatives regarding colonoscopy have been reviewed with the patient.  Questions have been answered.  All parties agreeable.

## 2020-07-05 NOTE — Op Note (Addendum)
[facility] Patient Name: Sarah Mcgrath Procedure Date: 07/05/2020 8:34 AM MRN: [id number] Account #: [id number] Date of Birth: 1971/04/24 Admit Type: Outpatient Age: 50 Room: [facility] OR ROOM 01 Gender: Female Note Status: Supervisor Override Procedure:             Colonoscopy Indications:           Screening for colorectal malignant neoplasm Providers:             Lucilla Lame MD, MD Referring MD:          Juline Patch, MD (Referring MD) Medicines:             Propofol per Anesthesia Complications:         No immediate complications. Procedure:             Pre-Anesthesia Assessment:                        - Prior to the procedure, a History and Physical was                         performed, and patient medications and allergies were                         reviewed. The patient's tolerance of previous                         anesthesia was also reviewed. The risks and benefits                         of the procedure and the sedation options and risks                         were discussed with the patient. All questions were                         answered, and informed consent was obtained. Prior                         Anticoagulants: The patient has taken no previous                         anticoagulant or antiplatelet agents. ASA Grade                         Assessment: II - A patient with mild systemic disease.                         After reviewing the risks and benefits, the patient                         was deemed in satisfactory condition to undergo the                         procedure.                        After obtaining informed consent, the colonoscope was  passed under direct vision. Throughout the procedure,                         the patient's blood pressure, pulse, and oxygen                         saturations were monitored continuously. The was                         introduced through the  anus and advanced to the the                         cecum, identified by appendiceal orifice and ileocecal                         valve. The colonoscopy was performed without                         difficulty. The patient tolerated the procedure well.                         The quality of the bowel preparation was excellent. Findings:      The perianal and digital rectal examinations were normal.      The colon (entire examined portion) appeared normal. Impression:            - The entire examined colon is normal.                        - No specimens collected. Recommendation:        - Discharge patient to home.                        - Resume previous diet.                        - Continue present medications.                        - Repeat colonoscopy in 10 years for screening                         purposes.                        - unless any change in family history or lower GI                         problems. Procedure Code(s):     --- Professional ---                        [phone], Colonoscopy, flexible; diagnostic, including                         collection of specimen(s) by brushing or washing, when                         performed (separate procedure) Diagnosis Code(s):     --- Professional ---  Z12.11, Encounter for screening for malignant neoplasm                         of colon CPT copyright 2019 [employer]. All rights reserved. The codes documented in this report are preliminary and upon coder review may  be revised to meet current compliance requirements. Lucilla Lame MD, MD 07/05/2020 9:01:11 AM This report has been signed electronically. Number of Addenda: 0 Note Initiated On: 07/05/2020 8:34 AM Scope Withdrawal Time: 0 hours 7 minutes 20 seconds  Total Procedure Duration: 0 hours 12 minutes 13 seconds  Estimated Blood Loss:  Estimated blood loss: none.      [facility]

## 2020-07-05 NOTE — Transfer of Care (Signed)
Immediate Anesthesia Transfer of Care Note  Patient: Sarah Mcgrath  Procedure(s) Performed: COLONOSCOPY WITH PROPOFOL (N/A )  Patient Location: PACU  Anesthesia Type: General  Level of Consciousness: awake, alert  and patient cooperative  Airway and Oxygen Therapy: Patient Spontanous Breathing and Patient connected to supplemental oxygen  Post-op Assessment: Post-op Vital signs reviewed, Patient's Cardiovascular Status Stable, Respiratory Function Stable, Patent Airway and No signs of Nausea or vomiting  Post-op Vital Signs: Reviewed and stable  Complications: No complications documented.

## 2020-07-08 ENCOUNTER — Telehealth: Payer: Self-pay

## 2020-07-08 NOTE — Telephone Encounter (Unsigned)
Copied from [place] [id number]. Topic: General - Other >> Jul 08, 2020  3:57 PM Hinda Lenis D wrote: PT need a call back from Baxter Flattery about personal questions // please advise

## 2020-07-09 NOTE — Telephone Encounter (Signed)
Pt is calling and would like tara to return her call she has some additional questions

## 2020-07-09 NOTE — Telephone Encounter (Signed)
Returned call and told her to take chol med at same time each day, doesn't matter if breakfast or supper and may take a 50 plus women's vitamin

## 2020-07-09 NOTE — Telephone Encounter (Signed)
Send to derm

## 2020-07-19 ENCOUNTER — Other Ambulatory Visit: Payer: Self-pay | Admitting: Family Medicine

## 2020-07-19 DIAGNOSIS — I10 Essential (primary) hypertension: Secondary | ICD-10-CM

## 2020-09-19 ENCOUNTER — Telehealth: Payer: Self-pay | Admitting: Family Medicine

## 2020-09-19 NOTE — Telephone Encounter (Signed)
Called and advised pt to call pharmacy and see if her lot number is included in recall, then call us back if it was

## 2020-09-19 NOTE — Telephone Encounter (Signed)
Patient called to ask Baxter Flattery to call regarding a recall on her BP medication, hydrochlorothiazide (HYDRODIURIL) 25 MG tablet.  She has concerns and would like Baxter Flattery to call her back at [phone]

## 2020-10-10 IMAGING — MG DIGITAL SCREENING BILAT W/ TOMO W/ CAD
8 series · 8 of 24 positions shown · non-contrast
Comparison: Previous exam(s).

CLINICAL DATA: Screening.

EXAM:
DIGITAL SCREENING BILATERAL MAMMOGRAM WITH TOMO AND CAD

[L MLO synth-2D]
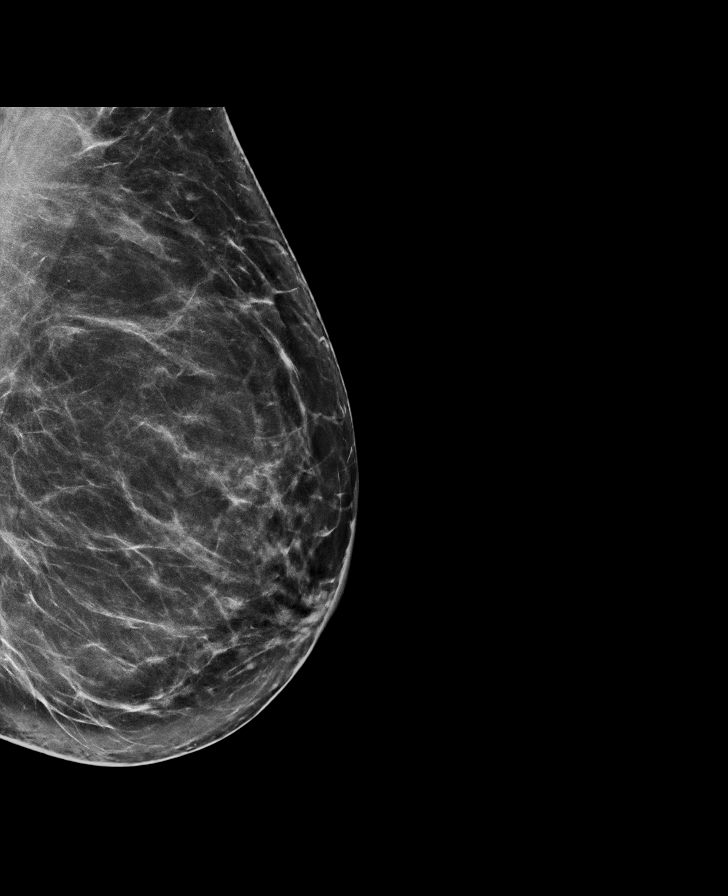

[R MLO synth-2D]
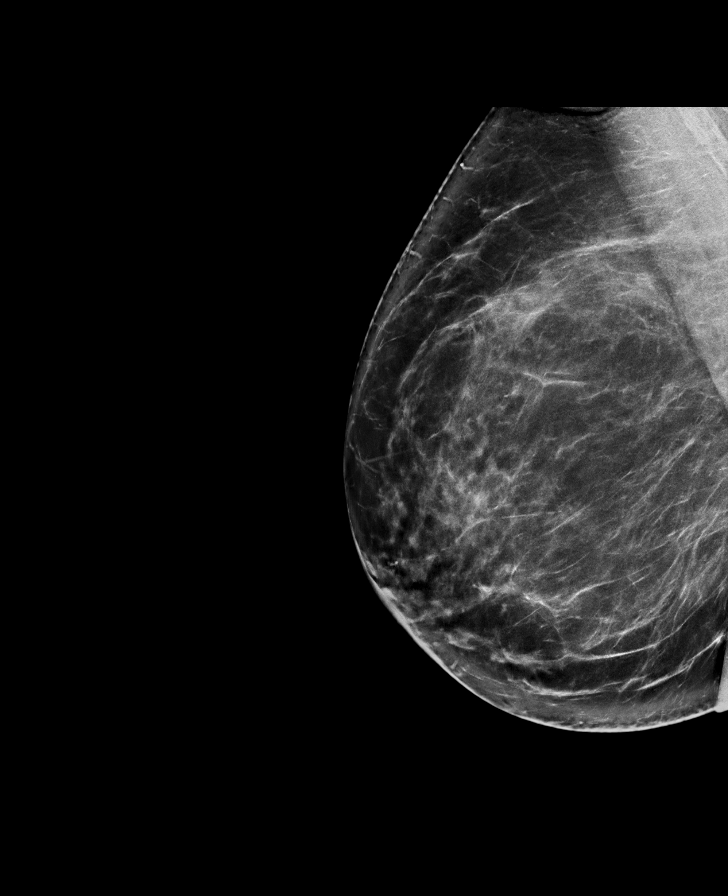

[L CC synth-2D]
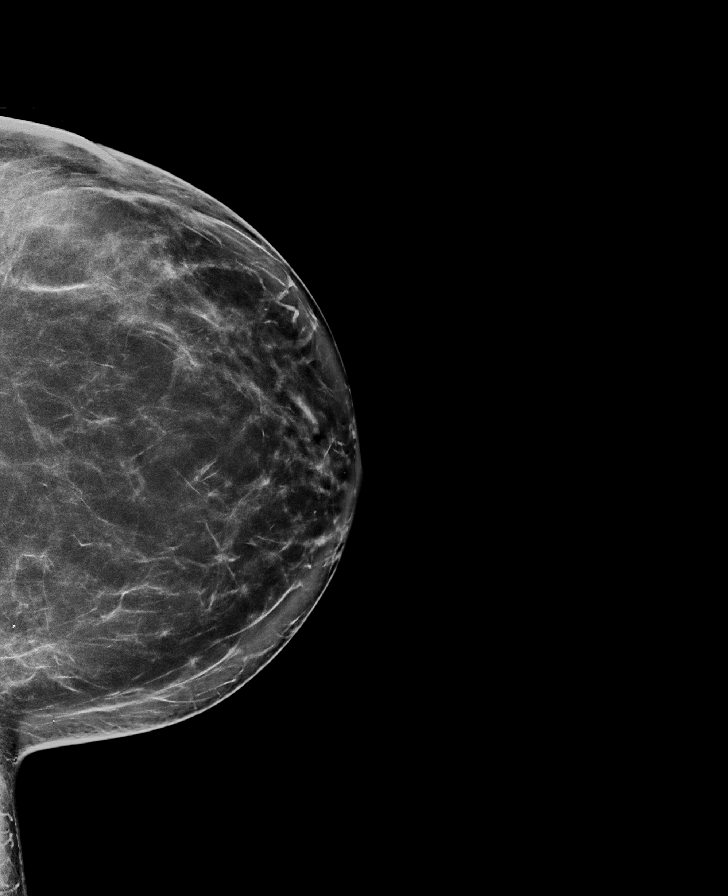

[R CC synth-2D]
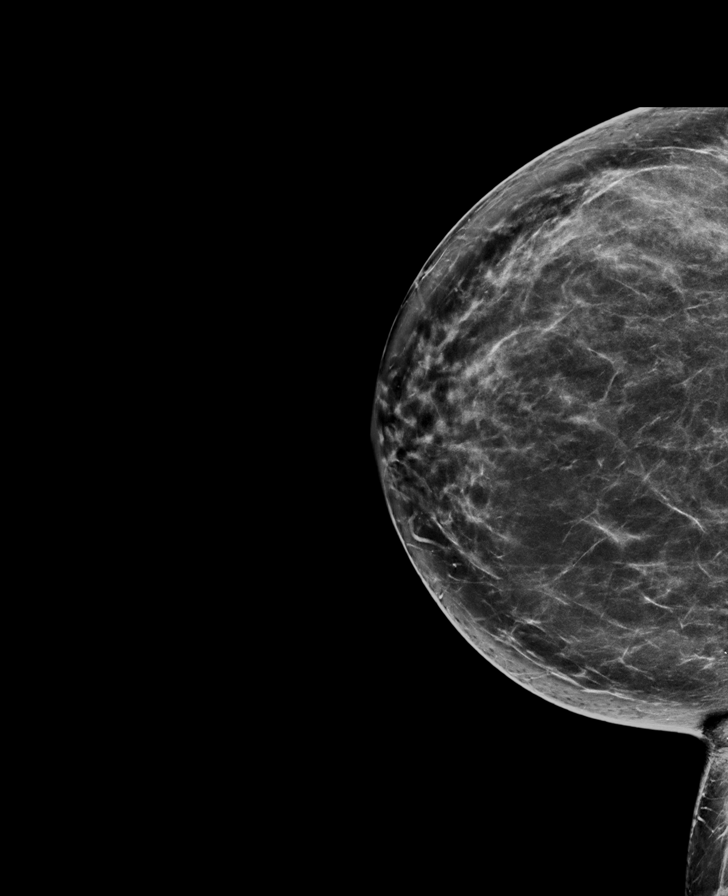

[L CC tomo · tomo slice 53/105.0]
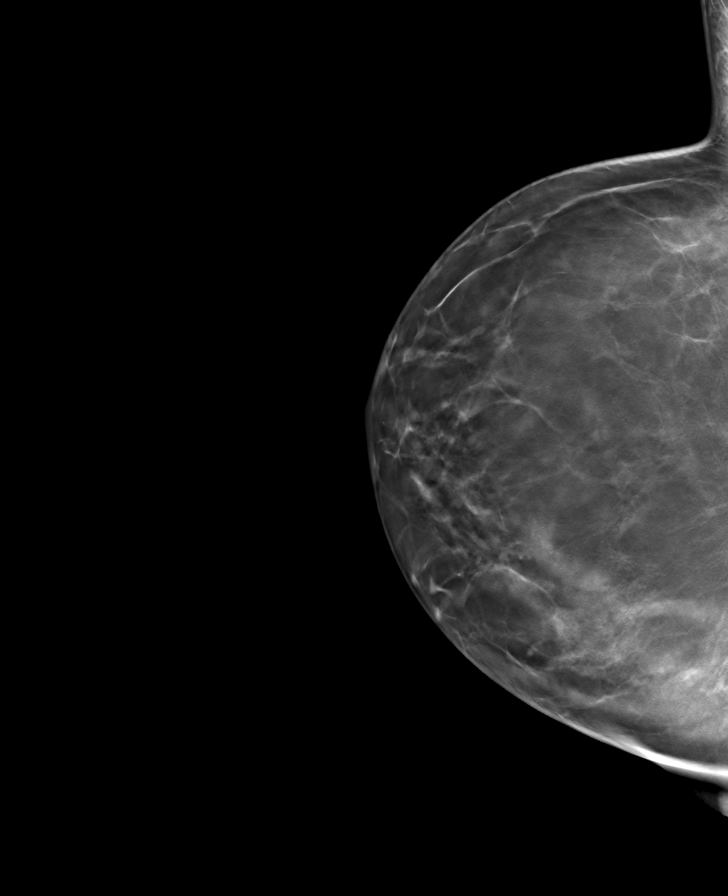

[R MLO tomo · tomo slice 53/105.0]
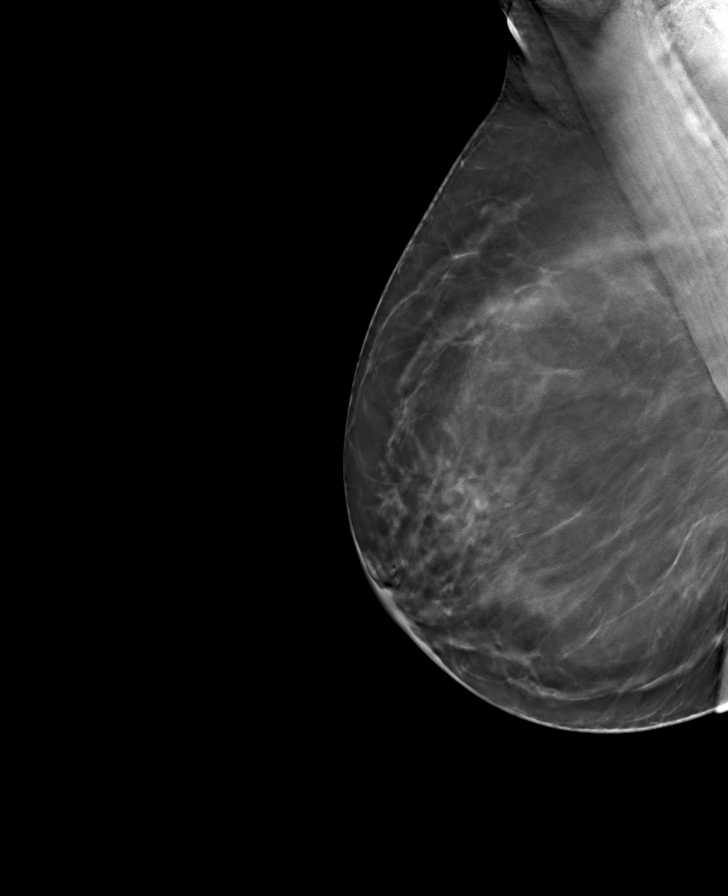

[R CC tomo · tomo slice 48/95.0]
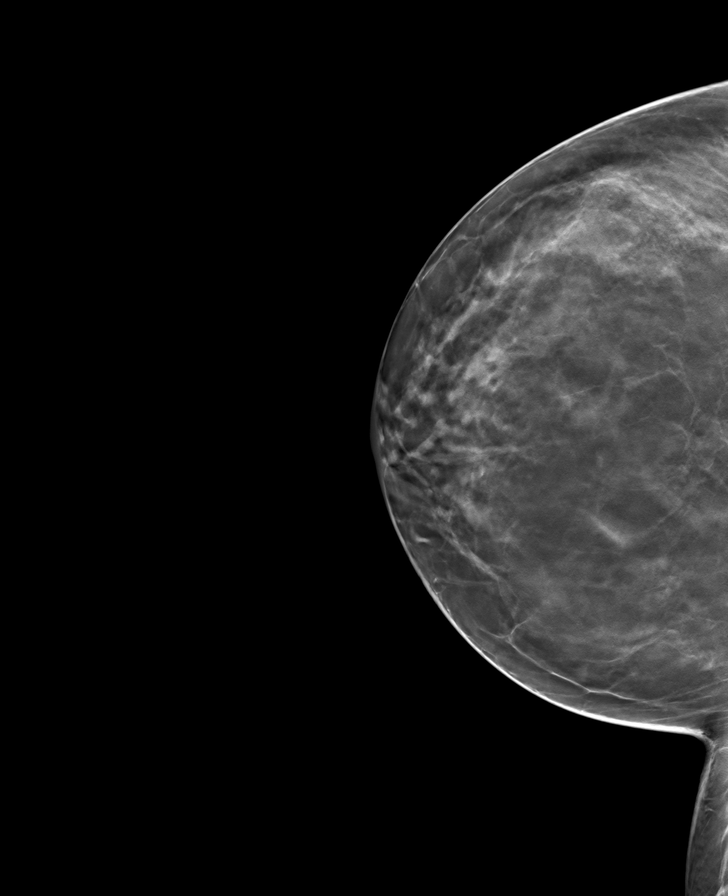

[L MLO tomo · tomo slice 49/97.0]
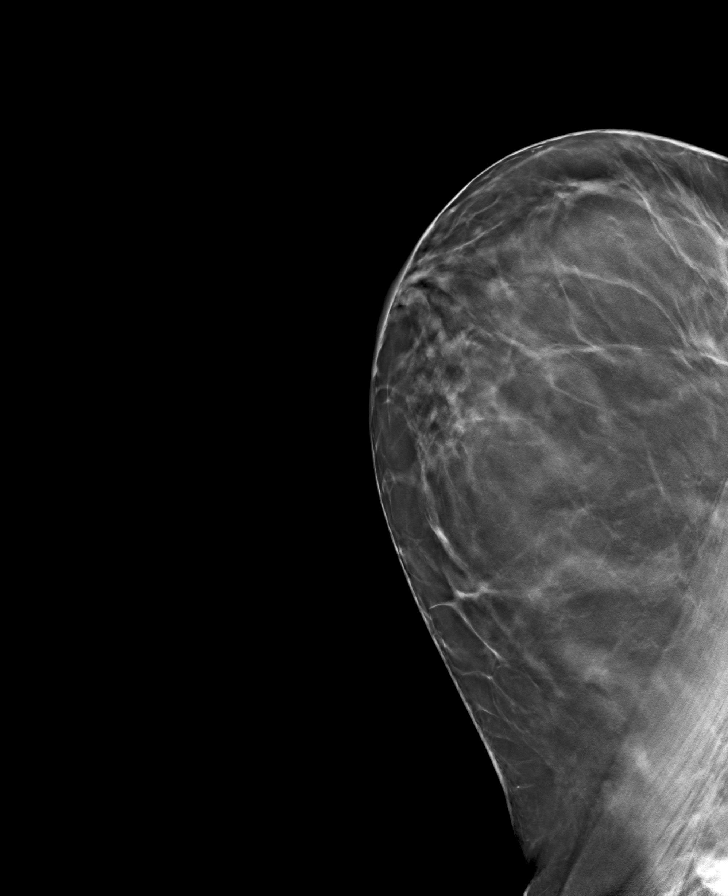

[8 of 24 positions shown; findings below may reference images not displayed]

ACR Breast Density Category b: There are scattered areas of
fibroglandular density.
FINDINGS: There are no findings suspicious for malignancy. Images were
processed with CAD.
IMPRESSION: No mammographic evidence of malignancy. A result letter of this
screening mammogram will be mailed directly to the patient.

RECOMMENDATION:
Screening mammogram in one year. (Code:[id number])

BI-RADS CATEGORY  1: Negative.

## 2020-11-13 ENCOUNTER — Other Ambulatory Visit: Payer: Self-pay

## 2020-11-13 ENCOUNTER — Ambulatory Visit: Payer: BC Managed Care – PPO | Admitting: Family Medicine

## 2020-11-13 ENCOUNTER — Encounter: Payer: Self-pay | Admitting: Family Medicine

## 2020-11-13 VITALS — BP 110/80 | HR 76 | Ht 63.0 in | Wt 158.0 lb

## 2020-11-13 DIAGNOSIS — I10 Essential (primary) hypertension: Secondary | ICD-10-CM

## 2020-11-13 DIAGNOSIS — E7801 Familial hypercholesterolemia: Secondary | ICD-10-CM

## 2020-11-13 MED ORDER — ATORVASTATIN CALCIUM 10 MG PO TABS: 10.0000 mg | ORAL_TABLET | Freq: Every day | ORAL | 1 refills | Status: DC

## 2020-11-13 MED ORDER — AMLODIPINE BESYLATE 5 MG PO TABS: 1.0000 | ORAL_TABLET | Freq: Every day | ORAL | 1 refills | Status: DC

## 2020-11-13 MED ORDER — HYDROCHLOROTHIAZIDE 25 MG PO TABS: 25.0000 mg | ORAL_TABLET | Freq: Every day | ORAL | 1 refills | Status: DC

## 2020-11-13 NOTE — Patient Instructions (Signed)

## 2020-11-13 NOTE — Progress Notes (Signed)
Date:  11/13/2020   Name:  Sarah Mcgrath   DOB:  [birth year]   MRN:  [id number]   Chief Complaint: Hypertension and Hyperlipidemia  Hypertension This is a chronic problem. The current episode started more than 1 year ago. The problem has been gradually improving since onset. The problem is controlled. Pertinent negatives include no anxiety, blurred vision, chest pain, headaches, malaise/fatigue, neck pain, orthopnea, palpitations, peripheral edema, PND, shortness of breath or sweats. There are no associated agents to hypertension. There are no known risk factors for coronary artery disease. Past treatments include calcium channel blockers and diuretics. The current treatment provides moderate improvement. There are no compliance problems.  There is no history of angina, kidney disease, CAD/MI, CVA, heart failure, left ventricular hypertrophy, PVD or retinopathy. There is no history of chronic renal disease, a hypertension causing med or renovascular disease.  Hyperlipidemia This is a chronic problem. The current episode started more than 1 year ago. The problem is controlled. Recent lipid tests were reviewed and are normal. She has no history of chronic renal disease. Factors aggravating her hyperlipidemia include thiazides. Pertinent negatives include no chest pain, myalgias or shortness of breath. Current antihyperlipidemic treatment includes statins. The current treatment provides moderate improvement of lipids. There are no compliance problems.  Risk factors for coronary artery disease include dyslipidemia and hypertension.    Lab Results  Component Value Date   CREATININE 0.78 06/04/2020   BUN 12 06/04/2020   NA 142 06/04/2020   K 3.9 06/04/2020   CL 102 06/04/2020   CO2 25 06/04/2020   Lab Results  Component Value Date   CHOL 189 06/04/2020   HDL 72 06/04/2020   LDLCALC 103 (H) 06/04/2020   TRIG 74 06/04/2020   CHOLHDL 3.9 12/20/2018   Lab Results  Component Value  Date   TSH 1.310 01/03/2015   No results found for: HGBA1C Lab Results  Component Value Date   WBC 5.6 03/04/2017   HGB 12.8 03/04/2017   HCT 37.6 03/04/2017   MCV 80 03/04/2017   PLT 339 03/04/2017   Lab Results  Component Value Date   ALT 33 (H) 06/04/2020   AST 29 06/04/2020   ALKPHOS 79 06/04/2020   BILITOT 0.7 06/04/2020     Review of Systems  Constitutional: Negative.  Negative for chills, fatigue, fever, malaise/fatigue and unexpected weight change.  HENT: Negative for congestion, ear discharge, ear pain, rhinorrhea, sinus pressure, sneezing and sore throat.   Eyes: Negative for blurred vision, photophobia, pain, discharge, redness and itching.  Respiratory: Negative for cough, shortness of breath, wheezing and stridor.   Cardiovascular: Negative for chest pain, palpitations, orthopnea and PND.  Gastrointestinal: Negative for abdominal pain, blood in stool, constipation, diarrhea, nausea and vomiting.  Endocrine: Negative for cold intolerance, heat intolerance, polydipsia, polyphagia and polyuria.  Genitourinary: Negative for dysuria, flank pain, frequency, hematuria, menstrual problem, pelvic pain, urgency, vaginal bleeding and vaginal discharge.  Musculoskeletal: Negative for arthralgias, back pain, myalgias and neck pain.  Skin: Negative for rash.  Allergic/Immunologic: Negative for environmental allergies and food allergies.  Neurological: Negative for dizziness, weakness, light-headedness, numbness and headaches.  Hematological: Negative for adenopathy. Does not bruise/bleed easily.  Psychiatric/Behavioral: Negative for dysphoric mood. The patient is not nervous/anxious.     Patient Active Problem List   Diagnosis Date Noted  . Special screening for malignant neoplasms, colon   . 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 Essential hypertension 11/06/2014  . HLD (hyperlipidemia) 11/06/2014  . Drug noncompliance 11/06/2014  .  Screening for depression 11/06/2014  .  ASCUS with positive high risk HPV cervical 12/19/2013    No Known Allergies  Past Surgical History:  Procedure Laterality Date  . COLONOSCOPY WITH PROPOFOL N/A 07/05/2020   Procedure: COLONOSCOPY WITH PROPOFOL;  Surgeon: Lucilla Lame, MD;  Location: [facility];  Service: Endoscopy;  Laterality: N/A;  priority 4  . fibroid      Social History   Tobacco Use  . Smoking status: Never Smoker  . Smokeless tobacco: Never Used  Substance Use Topics  . Alcohol use: Yes    Alcohol/week: 0.0 standard drinks  . Drug use: No     Medication list has been reviewed and updated.  Current Meds  Medication Sig  . amLODipine (NORVASC) 5 MG tablet TAKE 1 TABLET BY MOUTH EVERY DAY  . atorvastatin (LIPITOR) 10 MG tablet Take 1 tablet (10 mg total) by mouth daily.  . hydrochlorothiazide (HYDRODIURIL) 25 MG tablet Take 1 tablet (25 mg total) by mouth daily.    PHQ 2/9 Scores 11/13/2020 12/29/2019 12/18/2019 06/26/2019  PHQ - 2 Score 0 0 0 0  PHQ- 9 Score 0 0 0 0    GAD 7 : Generalized Anxiety Score 11/13/2020 12/29/2019 12/18/2019  Nervous, Anxious, on Edge 0 0 0  Control/stop worrying 0 0 0  Worry too much - different things 0 0 0  Trouble relaxing 0 0 0  Restless 0 0 0  Easily annoyed or irritable 0 0 0  Afraid - awful might happen 0 0 0  Total GAD 7 Score 0 0 0  Anxiety Difficulty - - Not difficult at all    BP Readings from Last 3 Encounters:  11/13/20 110/80  07/05/20 122/72  06/04/20 120/88    Physical Exam Vitals and nursing note reviewed.  HENT:     Head: Normocephalic.     Right Ear: Tympanic membrane, ear canal and external ear normal.     Left Ear: Tympanic membrane, ear canal and external ear normal.     Nose: Nose normal. No congestion or rhinorrhea.     Mouth/Throat:     Mouth: Mucous membranes are moist.  Eyes:     General: No scleral icterus.       Right eye: No discharge.        Left eye: No discharge.     Conjunctiva/sclera: Conjunctivae normal.      Pupils: Pupils are equal, round, and reactive to light.  Neck:     Thyroid: No thyromegaly.     Vascular: No JVD.     Trachea: No tracheal deviation.  Cardiovascular:     Rate and Rhythm: Normal rate and regular rhythm.     Heart sounds: Normal heart sounds, S1 normal and S2 normal. No murmur heard.  No systolic murmur is present.  No diastolic murmur is present. No friction rub. No gallop. No S3 or S4 sounds.   Pulmonary:     Effort: No respiratory distress.     Breath sounds: Normal breath sounds. No wheezing, rhonchi or rales.  Abdominal:     General: Bowel sounds are normal.     Palpations: Abdomen is soft. There is no mass.     Tenderness: There is no abdominal tenderness. There is no guarding or rebound.  Musculoskeletal:        General: No tenderness. Normal range of motion.     Cervical back: Normal range of motion and neck supple.     Right lower leg: No edema.  Left lower leg: No edema.  Lymphadenopathy:     Cervical: No cervical adenopathy.  Skin:    General: Skin is warm.     Findings: No rash.  Neurological:     Mental Status: She is alert and oriented to person, place, and time.     Cranial Nerves: No cranial nerve deficit.     Sensory: No sensory deficit.     Deep Tendon Reflexes: Reflexes are normal and symmetric.     Wt Readings from Last 3 Encounters:  11/13/20 158 lb (71.7 kg)  07/05/20 151 lb (68.5 kg)  06/04/20 151 lb (68.5 kg)    BP 110/80   Pulse 76   Ht 5\' 3"  (1.6 m)   Wt 158 lb (71.7 kg)   BMI 27.99 kg/m   Assessment and Plan:  1. Essential hypertension Chronic.  Controlled.  Stable.  Blood pressure today is 110/80.  Continue amlodipine 5 mg once a day and hydrochlorothiazide 25 mg once a day.  Will check CMP for electrolytes and GFR. - amLODipine (NORVASC) 5 MG tablet; Take 1 tablet (5 mg total) by mouth daily.  Dispense: 90 tablet; Refill: 1 - hydrochlorothiazide (HYDRODIURIL) 25 MG tablet; Take 1 tablet (25 mg total) by mouth daily.   Dispense: 90 tablet; Refill: 1 - Comprehensive Metabolic Panel (CMET)  2. Familial hypercholesterolemia Chronic.  Controlled.  Stable.  Continue atorvastatin 10 mg once a day. - atorvastatin (LIPITOR) 10 MG tablet; Take 1 tablet (10 mg total) by mouth daily.  Dispense: 90 tablet; Refill: 1 - Lipid Panel With LDL/HDL Ratio  BMI was noted to be over 27 and we will give Mediterranean diet for weight control and improvement of medical risk factors

## 2020-11-14 LAB — COMPREHENSIVE METABOLIC PANEL
ALT: 34 IU/L — ABNORMAL HIGH (ref 0–32)
AST: 28 IU/L (ref 0–40)
Albumin/Globulin Ratio: 1.4 (ref 1.2–2.2)
Albumin: 4.4 g/dL (ref 3.8–4.8)
Alkaline Phosphatase: 79 IU/L (ref 44–121)
BUN/Creatinine Ratio: 16 (ref 9–23)
BUN: 13 mg/dL (ref 6–24)
Bilirubin Total: 0.6 mg/dL (ref 0.0–1.2)
CO2: 25 mmol/L (ref 20–29)
Calcium: 10 mg/dL (ref 8.7–10.2)
Chloride: 100 mmol/L (ref 96–106)
Creatinine, Ser: 0.81 mg/dL (ref 0.57–1.00)
Globulin, Total: 3.1 g/dL (ref 1.5–4.5)
Glucose: 91 mg/dL (ref 65–99)
Potassium: 3.8 mmol/L (ref 3.5–5.2)
Sodium: 140 mmol/L (ref 134–144)
Total Protein: 7.5 g/dL (ref 6.0–8.5)
eGFR: 88 mL/min/{1.73_m2} (ref >59)

## 2020-11-14 LAB — LIPID PANEL WITH LDL/HDL RATIO
Cholesterol, Total: 172 mg/dL (ref 100–199)
HDL: 69 mg/dL (ref >39)
LDL Chol Calc (NIH): 91 mg/dL (ref 0–99)
LDL/HDL Ratio: 1.3 ratio (ref 0.0–3.2)
Triglycerides: 63 mg/dL (ref 0–149)
VLDL Cholesterol Cal: 12 mg/dL (ref 5–40)

## 2020-12-17 ENCOUNTER — Ambulatory Visit: Payer: BC Managed Care – PPO

## 2020-12-24 ENCOUNTER — Other Ambulatory Visit: Payer: Self-pay

## 2020-12-24 ENCOUNTER — Ambulatory Visit (INDEPENDENT_AMBULATORY_CARE_PROVIDER_SITE_OTHER): Payer: BC Managed Care – PPO

## 2020-12-24 DIAGNOSIS — Z23 Encounter for immunization: Secondary | ICD-10-CM | POA: Diagnosis not present

## 2021-01-14 LAB — HM PAP SMEAR: HM Pap smear: NORMAL

## 2021-03-21 ENCOUNTER — Other Ambulatory Visit: Payer: Self-pay | Admitting: Family Medicine

## 2021-03-21 DIAGNOSIS — Z1231 Encounter for screening mammogram for malignant neoplasm of breast: Secondary | ICD-10-CM

## 2021-03-27 ENCOUNTER — Other Ambulatory Visit: Payer: Self-pay

## 2021-03-27 ENCOUNTER — Ambulatory Visit (INDEPENDENT_AMBULATORY_CARE_PROVIDER_SITE_OTHER): Payer: BC Managed Care – PPO

## 2021-03-27 DIAGNOSIS — Z23 Encounter for immunization: Secondary | ICD-10-CM

## 2021-04-13 ENCOUNTER — Other Ambulatory Visit: Payer: Self-pay | Admitting: Family Medicine

## 2021-04-13 DIAGNOSIS — N951 Menopausal and female climacteric states: Secondary | ICD-10-CM

## 2021-04-13 NOTE — Telephone Encounter (Signed)
Prescriber not at this practice.

## 2021-04-30 ENCOUNTER — Other Ambulatory Visit: Payer: Self-pay

## 2021-04-30 ENCOUNTER — Encounter: Payer: Self-pay | Admitting: Family Medicine

## 2021-04-30 ENCOUNTER — Ambulatory Visit: Payer: BC Managed Care – PPO | Admitting: Family Medicine

## 2021-04-30 DIAGNOSIS — E7801 Familial hypercholesterolemia: Secondary | ICD-10-CM | POA: Diagnosis not present

## 2021-04-30 DIAGNOSIS — I10 Essential (primary) hypertension: Secondary | ICD-10-CM

## 2021-04-30 MED ORDER — AMLODIPINE BESYLATE 5 MG PO TABS: 5.0000 mg | ORAL_TABLET | Freq: Every day | ORAL | 1 refills | Status: DC

## 2021-04-30 MED ORDER — ATORVASTATIN CALCIUM 10 MG PO TABS: 10.0000 mg | ORAL_TABLET | Freq: Every day | ORAL | 1 refills | Status: DC

## 2021-04-30 MED ORDER — HYDROCHLOROTHIAZIDE 25 MG PO TABS: 25.0000 mg | ORAL_TABLET | Freq: Every day | ORAL | 1 refills | Status: DC

## 2021-04-30 NOTE — Progress Notes (Signed)
Date:  04/30/2021   Name:  Sarah Mcgrath   DOB:  [birth year]   MRN:  [id number]   Chief Complaint: Hypertension and Hyperlipidemia  Hypertension This is a chronic problem. The current episode started more than 1 year ago. The problem has been gradually improving since onset. The problem is controlled. Pertinent negatives include no anxiety, blurred vision, chest pain, headaches, malaise/fatigue, neck pain, orthopnea, palpitations, peripheral edema, PND, shortness of breath or sweats. There are no associated agents to hypertension. There are no known risk factors for coronary artery disease. Past treatments include calcium channel blockers and diuretics. The current treatment provides moderate improvement. There are no compliance problems.  There is no history of angina, kidney disease, CAD/MI, CVA, heart failure, left ventricular hypertrophy, PVD or retinopathy. There is no history of chronic renal disease, a hypertension causing med or renovascular disease.  Hyperlipidemia This is a chronic problem. The current episode started more than 1 year ago. The problem is controlled. Recent lipid tests were reviewed and are normal. She has no history of chronic renal disease. Pertinent negatives include no chest pain, focal sensory loss, focal weakness, leg pain, myalgias or shortness of breath. Current antihyperlipidemic treatment includes statins. The current treatment provides moderate improvement of lipids. There are no compliance problems.  There are no known risk factors for coronary artery disease.   Lab Results  Component Value Date   CREATININE 0.81 11/13/2020   BUN 13 11/13/2020   NA 140 11/13/2020   K 3.8 11/13/2020   CL 100 11/13/2020   CO2 25 11/13/2020   Lab Results  Component Value Date   CHOL 172 11/13/2020   HDL 69 11/13/2020   LDLCALC 91 11/13/2020   TRIG 63 11/13/2020   CHOLHDL 3.9 12/20/2018   Lab Results  Component Value Date   TSH 1.310 01/03/2015   No  results found for: HGBA1C Lab Results  Component Value Date   WBC 5.6 03/04/2017   HGB 12.8 03/04/2017   HCT 37.6 03/04/2017   MCV 80 03/04/2017   PLT 339 03/04/2017   Lab Results  Component Value Date   ALT 34 (H) 11/13/2020   AST 28 11/13/2020   ALKPHOS 79 11/13/2020   BILITOT 0.6 11/13/2020     Review of Systems  Constitutional:  Negative for diaphoresis, fatigue, malaise/fatigue and unexpected weight change.  Eyes:  Negative for blurred vision.  Respiratory: Negative.  Negative for shortness of breath.   Cardiovascular: Negative.  Negative for chest pain, palpitations, orthopnea and PND.  Gastrointestinal: Negative.   Genitourinary: Negative.   Musculoskeletal:  Negative for myalgias and neck pain.  Neurological:  Negative for focal weakness and headaches.   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   Essential hypertension 11/06/2014   HLD (hyperlipidemia) 11/06/2014   Drug noncompliance 11/06/2014   Screening for depression 11/06/2014   ASCUS with positive high risk HPV cervical 12/19/2013    No Known Allergies  Past Surgical History:  Procedure Laterality Date   COLONOSCOPY WITH PROPOFOL N/A 07/05/2020   Procedure: COLONOSCOPY WITH PROPOFOL;  Surgeon: Lucilla Lame, MD;  Location: [facility];  Service: Endoscopy;  Laterality: N/A;  priority 4   fibroid      Social History   Tobacco Use   Smoking status: Never   Smokeless tobacco: Never  Substance Use Topics   Alcohol use: Yes    Alcohol/week: 0.0 standard  drinks   Drug use: No     Medication list has been reviewed and updated.  Current Meds  Medication Sig   amLODipine (NORVASC) 5 MG tablet Take 1 tablet (5 mg total) by mouth daily.   atorvastatin (LIPITOR) 10 MG tablet Take 1 tablet (10 mg total) by mouth daily.   hydrochlorothiazide (HYDRODIURIL) 25 MG tablet Take 1 tablet (25 mg total) by  mouth daily.    PHQ 2/9 Scores 04/30/2021 11/13/2020 12/29/2019 12/18/2019  PHQ - 2 Score 0 0 0 0  PHQ- 9 Score 0 0 0 0    GAD 7 : Generalized Anxiety Score 04/30/2021 11/13/2020 12/29/2019 12/18/2019  Nervous, Anxious, on Edge 0 0 0 0  Control/stop worrying 0 0 0 0  Worry too much - different things 0 0 0 0  Trouble relaxing 0 0 0 0  Restless 0 0 0 0  Easily annoyed or irritable 0 0 0 0  Afraid - awful might happen 0 0 0 0  Total GAD 7 Score 0 0 0 0  Anxiety Difficulty - - - Not difficult at all    BP Readings from Last 3 Encounters:  04/30/21 120/80  11/13/20 110/80  07/05/20 122/72    Physical Exam Vitals and nursing note reviewed.  Constitutional:      General: She is not in acute distress.    Appearance: She is not diaphoretic.  HENT:     Head: Normocephalic and atraumatic.     Right Ear: Tympanic membrane and external ear normal.     Left Ear: Tympanic membrane and external ear normal.     Nose: Nose normal. No congestion or rhinorrhea.  Eyes:     General:        Right eye: No discharge.        Left eye: No discharge.     Conjunctiva/sclera: Conjunctivae normal.     Pupils: Pupils are equal, round, and reactive to light.  Neck:     Thyroid: No thyromegaly.     Vascular: No JVD.  Cardiovascular:     Rate and Rhythm: Normal rate and regular rhythm.     Heart sounds: Normal heart sounds. No murmur heard.   No friction rub. No gallop.  Pulmonary:     Effort: Pulmonary effort is normal.     Breath sounds: Normal breath sounds. No wheezing or rhonchi.  Abdominal:     General: Bowel sounds are normal.     Palpations: Abdomen is soft. There is no mass.     Tenderness: There is no abdominal tenderness. There is no guarding.  Musculoskeletal:        General: Normal range of motion.     Cervical back: Normal range of motion and neck supple.  Lymphadenopathy:     Cervical: No cervical adenopathy.  Skin:    General: Skin is warm and dry.  Neurological:     Mental  Status: She is alert.     Deep Tendon Reflexes: Reflexes are normal and symmetric.    Wt Readings from Last 3 Encounters:  04/30/21 155 lb (70.3 kg)  11/13/20 158 lb (71.7 kg)  07/05/20 151 lb (68.5 kg)    BP 120/80   Pulse 78   Ht 5\' 3"  (1.6 m)   Wt 155 lb (70.3 kg)   BMI 27.46 kg/m   Assessment and Plan:  1. Essential hypertension Chronic.  Controlled.  Stable.  Uncomplicated.  Blood pressure today is 120/80.  Continue amlodipine 5 mg once a  day and hydrochlorothiazide 25 mg once a day.  Review of renal function panel is appropriate we will repeat in 6 months. - amLODipine (NORVASC) 5 MG tablet; Take 1 tablet (5 mg total) by mouth daily.  Dispense: 90 tablet; Refill: 1 - hydrochlorothiazide (HYDRODIURIL) 25 MG tablet; Take 1 tablet (25 mg total) by mouth daily.  Dispense: 90 tablet; Refill: 1  2. Familial hypercholesterolemia Chronic.  Controlled.  Stable.  Uncomplicated.  Patient is controlling with diet has lost another 2 pounds and atorvastatin 10 mg once a day.  Will recheck in 6 months says that she last lipid panel was well in normal limits. - atorvastatin (LIPITOR) 10 MG tablet; Take 1 tablet (10 mg total) by mouth daily.  Dispense: 90 tablet; Refill: 1

## 2021-05-12 ENCOUNTER — Ambulatory Visit
Admission: RE | Admit: 2021-05-12 | Discharge: 2021-05-12 | Disposition: A | Discharge status: Home / Self Care | Payer: BC Managed Care – PPO | Source: Ambulatory Visit | Attending: Family Medicine | Admitting: Family Medicine

## 2021-05-12 ENCOUNTER — Other Ambulatory Visit: Payer: Self-pay

## 2021-05-12 DIAGNOSIS — Z1231 Encounter for screening mammogram for malignant neoplasm of breast: Secondary | ICD-10-CM | POA: Insufficient documentation

## 2021-10-25 ENCOUNTER — Other Ambulatory Visit: Payer: Self-pay | Admitting: Family Medicine

## 2021-10-25 DIAGNOSIS — E7801 Familial hypercholesterolemia: Secondary | ICD-10-CM

## 2021-10-27 NOTE — Telephone Encounter (Signed)
Requested Prescriptions  ?Pending Prescriptions Disp Refills  ?? atorvastatin (LIPITOR) 10 MG tablet [Pharmacy Med Name: ATORVASTATIN 10 MG TABLET] 90 tablet 0  ?  Sig: TAKE 1 TABLET BY MOUTH EVERY DAY  ?  ? Cardiovascular:  Antilipid - Statins Failed - 10/25/2021  8:49 AM  ?  ?  Failed - Lipid Panel in normal range within the last 12 months  ?  Cholesterol, Total  ?Date Value Ref Range Status  ?11/13/2020 172 100 - 199 mg/dL Final  ? ?LDL Chol Calc (NIH)  ?Date Value Ref Range Status  ?11/13/2020 91 0 - 99 mg/dL Final  ? ?HDL  ?Date Value Ref Range Status  ?11/13/2020 69 >39 mg/dL Final  ? ?Triglycerides  ?Date Value Ref Range Status  ?11/13/2020 63 0 - 149 mg/dL Final  ? ?  ?  ?  Passed - Patient is not pregnant  ?  ?  Passed - Valid encounter within last 12 months  ?  Recent Outpatient Visits   ?      ? 6 months ago Essential hypertension  ? [facility] Juline Patch, MD  ? 11 months ago Essential hypertension  ? [facility] Juline Patch, MD  ? 1 year ago Essential hypertension  ? [facility] Juline Patch, MD  ? 1 year ago Venous insufficiency of both lower extremities  ? [facility] Juline Patch, MD  ? 1 year ago Essential hypertension  ? [facility] Juline Patch, MD  ?  ?  ?Future Appointments   ?        ? Tomorrow Juline Patch, MD [facility] [place]  ?  ? ?  ?  ?  ? ?

## 2021-10-28 ENCOUNTER — Ambulatory Visit: Payer: BC Managed Care – PPO | Admitting: Family Medicine

## 2021-10-28 ENCOUNTER — Encounter: Payer: Self-pay | Admitting: Family Medicine

## 2021-10-28 VITALS — BP 130/70 | HR 72 | Ht 63.0 in | Wt 158.0 lb

## 2021-10-28 DIAGNOSIS — I10 Essential (primary) hypertension: Secondary | ICD-10-CM

## 2021-10-28 DIAGNOSIS — E7801 Familial hypercholesterolemia: Secondary | ICD-10-CM

## 2021-10-28 MED ORDER — ATORVASTATIN CALCIUM 10 MG PO TABS: 10.0000 mg | ORAL_TABLET | Freq: Every day | ORAL | 1 refills | Status: DC

## 2021-10-28 MED ORDER — HYDROCHLOROTHIAZIDE 25 MG PO TABS: 25.0000 mg | ORAL_TABLET | Freq: Every day | ORAL | 1 refills | Status: DC

## 2021-10-28 MED ORDER — AMLODIPINE BESYLATE 5 MG PO TABS
5.0000 mg | ORAL_TABLET | Freq: Every day | ORAL | 1 refills | Status: DC
Start: 2021-10-28 — End: 2022-04-30

## 2021-10-28 NOTE — Progress Notes (Signed)
? ? ?Date:  10/28/2021  ? ?Name:  Sarah Mcgrath   DOB:  11-21-1970   MRN:  [id number] ? ? ?Chief Complaint: Hypertension and Hyperlipidemia ? ?Hypertension ?This is a chronic problem. The current episode started more than 1 year ago. The problem has been gradually improving since onset. The problem is controlled. Pertinent negatives include no chest pain, headaches, orthopnea, palpitations, peripheral edema, PND, shortness of breath or sweats. Past treatments include calcium channel blockers and diuretics. The current treatment provides moderate improvement. There are no compliance problems.  There is no history of angina, kidney disease, CAD/MI, CVA, heart failure, left ventricular hypertrophy, PVD or retinopathy. There is no history of chronic renal disease, a hypertension causing med or renovascular disease.  ?Hyperlipidemia ?This is a chronic problem. The current episode started more than 1 year ago. The problem is controlled. Recent lipid tests were reviewed and are normal. She has no history of chronic renal disease. Factors aggravating her hyperlipidemia include thiazides. Pertinent negatives include no chest pain, focal sensory loss, focal weakness, leg pain, myalgias or shortness of breath. Current antihyperlipidemic treatment includes statins. The current treatment provides moderate improvement of lipids. There are no compliance problems.  Risk factors for coronary artery disease include dyslipidemia.  ? ?Lab Results  ?Component Value Date  ? NA 140 11/13/2020  ? K 3.8 11/13/2020  ? CO2 25 11/13/2020  ? GLUCOSE 91 11/13/2020  ? BUN 13 11/13/2020  ? CREATININE 0.81 11/13/2020  ? CALCIUM 10.0 11/13/2020  ? EGFR 88 11/13/2020  ? GFRNONAA 89 06/04/2020  ? ?Lab Results  ?Component Value Date  ? CHOL 172 11/13/2020  ? HDL 69 11/13/2020  ? [place] 91 11/13/2020  ? TRIG 63 11/13/2020  ? CHOLHDL 3.9 12/20/2018  ? ?Lab Results  ?Component Value Date  ? TSH 1.310 01/03/2015  ? ?No results found for:  HGBA1C ?Lab Results  ?Component Value Date  ? WBC 5.6 03/04/2017  ? HGB 12.8 03/04/2017  ? HCT 37.6 03/04/2017  ? MCV 80 03/04/2017  ? PLT 339 03/04/2017  ? ?Lab Results  ?Component Value Date  ? ALT 34 (H) 11/13/2020  ? AST 28 11/13/2020  ? ALKPHOS 79 11/13/2020  ? BILITOT 0.6 11/13/2020  ? ?No results found for: 25OHVITD2, [place], VD25OH  ? ?Review of Systems  ?Constitutional: Negative.  Negative for chills, fatigue, fever and unexpected weight change.  ?HENT:  Negative for congestion, ear discharge, ear pain, rhinorrhea, sinus pressure, sneezing and sore throat.   ?Respiratory:  Negative for cough, shortness of breath, wheezing and stridor.   ?Cardiovascular:  Negative for chest pain, palpitations, orthopnea and PND.  ?Gastrointestinal:  Negative for abdominal pain, blood in stool, constipation, diarrhea and nausea.  ?Genitourinary:  Negative for dysuria, flank pain, frequency, hematuria, urgency and vaginal discharge.  ?Musculoskeletal:  Negative for arthralgias, back pain and myalgias.  ?Skin:  Negative for rash.  ?Neurological:  Negative for dizziness, focal weakness, weakness and headaches.  ?Hematological:  Negative for adenopathy. Does not bruise/bleed easily.  ?Psychiatric/Behavioral:  Negative for dysphoric mood. The patient is not nervous/anxious.   ? ?Patient Active Problem List  ? Diagnosis Date Noted  ? Special screening for malignant neoplasms, colon   ? 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 Essential hypertension 11/06/2014  ? HLD (hyperlipidemia) 11/06/2014  ? Drug noncompliance 11/06/2014  ? Screening for depression 11/06/2014  ? ASCUS with positive high risk HPV cervical 12/19/2013  ? ? ?No Known Allergies ? ?Past Surgical History:  ?Procedure Laterality Date  ?  COLONOSCOPY WITH PROPOFOL N/A 07/05/2020  ? Procedure: COLONOSCOPY WITH PROPOFOL;  Surgeon: Lucilla Lame, MD;  Location: [place] [facility];  Service: Endoscopy;  Laterality: N/A;  priority 4  ? fibroid    ? ? ?Social History  ? ?Tobacco  Use  ? Smoking status: Never  ? Smokeless tobacco: Never  ?Substance Use Topics  ? Alcohol use: Yes  ?  Alcohol/week: 0.0 standard drinks  ? Drug use: No  ? ? ? ?Medication list has been reviewed and updated. ? ?Current Meds  ?Medication Sig  ? amLODipine (NORVASC) 5 MG tablet Take 1 tablet (5 mg total) by mouth daily.  ? atorvastatin (LIPITOR) 10 MG tablet TAKE 1 TABLET BY MOUTH EVERY DAY  ? hydrochlorothiazide (HYDRODIURIL) 25 MG tablet Take 1 tablet (25 mg total) by mouth daily.  ? ? ? ?  10/28/2021  ?  8:21 AM 04/30/2021  ?  8:05 AM 11/13/2020  ?  8:10 AM 12/29/2019  ?  3:45 PM  ?GAD 7 : Generalized Anxiety Score  ?Nervous, Anxious, on Edge 0 0 0 0  ?Control/stop worrying 0 0 0 0  ?Worry too much - different things 0 0 0 0  ?Trouble relaxing 0 0 0 0  ?Restless 0 0 0 0  ?Easily annoyed or irritable 0 0 0 0  ?Afraid - awful might happen 0 0 0 0  ?Total GAD 7 Score 0 0 0 0  ?Anxiety Difficulty Not difficult at all     ? ? ? ?  10/28/2021  ?  8:21 AM  ?Depression screen PHQ 2/9  ?Decreased Interest 0  ?Down, Depressed, Hopeless 0  ?PHQ - 2 Score 0  ?Altered sleeping 0  ?Tired, decreased energy 0  ?Change in appetite 0  ?Feeling bad or failure about yourself  0  ?Trouble concentrating 0  ?Moving slowly or fidgety/restless 0  ?Suicidal thoughts 0  ?PHQ-9 Score 0  ?Difficult doing work/chores Not difficult at all  ? ? ?BP Readings from Last 3 Encounters:  ?10/28/21 130/70  ?04/30/21 120/80  ?11/13/20 110/80  ? ? ?Physical Exam ?Vitals and nursing note reviewed. Exam conducted with a chaperone present.  ?Constitutional:   ?   General: She is not in acute distress. ?   Appearance: She is not diaphoretic.  ?HENT:  ?   Head: Normocephalic and atraumatic.  ?   Right Ear: External ear normal.  ?   Left Ear: External ear normal.  ?   Nose: Nose normal.  ?Eyes:  ?   General:     ?   Right eye: No discharge.     ?   Left eye: No discharge.  ?   Conjunctiva/sclera: Conjunctivae normal.  ?   Pupils: Pupils are equal, round, and reactive  to light.  ?Neck:  ?   Thyroid: No thyromegaly.  ?   Vascular: No JVD.  ?Cardiovascular:  ?   Rate and Rhythm: Normal rate and regular rhythm.  ?   Heart sounds: Normal heart sounds, S1 normal and S2 normal. No murmur heard. ?No systolic murmur is present.  ?No diastolic murmur is present.  ?  No friction rub. No gallop. No S3 or S4 sounds.  ?Pulmonary:  ?   Effort: Pulmonary effort is normal.  ?   Breath sounds: Normal breath sounds.  ?Abdominal:  ?   General: Bowel sounds are normal.  ?   Palpations: Abdomen is soft. There is no mass.  ?   Tenderness: There is no abdominal tenderness.  There is no guarding.  ?Musculoskeletal:     ?   General: Normal range of motion.  ?   Cervical back: Normal range of motion and neck supple.  ?Lymphadenopathy:  ?   Cervical: No cervical adenopathy.  ?Skin: ?   General: Skin is warm and dry.  ?Neurological:  ?   Mental Status: She is alert.  ?   Deep Tendon Reflexes: Reflexes are normal and symmetric.  ? ? ?Wt Readings from Last 3 Encounters:  ?10/28/21 158 lb (71.7 kg)  ?04/30/21 155 lb (70.3 kg)  ?11/13/20 158 lb (71.7 kg)  ? ? ?BP 130/70   Pulse 72   Ht 5' 3"  (1.6 m)   Wt 158 lb (71.7 kg)   BMI 27.99 kg/m?  ? ?Assessment and Plan: ? ?1. Essential hypertension ?Chronic.  Controlled.  Stable.  Blood pressure today is 130/70.  Which is acceptable.  Continue amlodipine 5 mg once a day and hydrochlorothiazide 25 mg once a day.  Will check CMP for electrolytes and GFR determination.  We will recheck patient in 6 months. ?- amLODipine (NORVASC) 5 MG tablet; Take 1 tablet (5 mg total) by mouth daily.  Dispense: 90 tablet; Refill: 1 ?- hydrochlorothiazide (HYDRODIURIL) 25 MG tablet; Take 1 tablet (25 mg total) by mouth daily.  Dispense: 90 tablet; Refill: 1 ?- Comprehensive Metabolic Panel (CMET) ? ?2. Familial hypercholesterolemia ?Chronic.  Controlled.  Stable.  Continue atorvastatin 10 mg once a day.  Will check lipid panel for current surveillance of LDL control. ?- atorvastatin  (LIPITOR) 10 MG tablet; Take 1 tablet (10 mg total) by mouth daily.  Dispense: 90 tablet; Refill: 1 ?- Lipid Panel With LDL/HDL Ratio  ? ? ?

## 2021-10-29 LAB — COMPREHENSIVE METABOLIC PANEL
ALT: 60 IU/L — ABNORMAL HIGH (ref 0–32)
AST: 49 IU/L — ABNORMAL HIGH (ref 0–40)
Albumin/Globulin Ratio: 1.8 (ref 1.2–2.2)
Albumin: 4.5 g/dL (ref 3.8–4.9)
Alkaline Phosphatase: 87 IU/L (ref 44–121)
BUN/Creatinine Ratio: 16 (ref 9–23)
BUN: 13 mg/dL (ref 6–24)
Bilirubin Total: 0.7 mg/dL (ref 0.0–1.2)
CO2: 24 mmol/L (ref 20–29)
Calcium: 10.1 mg/dL (ref 8.7–10.2)
Chloride: 100 mmol/L (ref 96–106)
Creatinine, Ser: 0.8 mg/dL (ref 0.57–1.00)
Globulin, Total: 2.5 g/dL (ref 1.5–4.5)
Glucose: 100 mg/dL — ABNORMAL HIGH (ref 70–99)
Potassium: 4 mmol/L (ref 3.5–5.2)
Sodium: 139 mmol/L (ref 134–144)
Total Protein: 7 g/dL (ref 6.0–8.5)
eGFR: 89 mL/min/{1.73_m2} (ref >59)

## 2021-10-29 LAB — LIPID PANEL WITH LDL/HDL RATIO
Cholesterol, Total: 176 mg/dL (ref 100–199)
HDL: 64 mg/dL (ref >39)
LDL Chol Calc (NIH): 98 mg/dL (ref 0–99)
LDL/HDL Ratio: 1.5 ratio (ref 0.0–3.2)
Triglycerides: 74 mg/dL (ref 0–149)
VLDL Cholesterol Cal: 14 mg/dL (ref 5–40)

## 2022-04-09 ENCOUNTER — Other Ambulatory Visit: Payer: Self-pay | Admitting: Family Medicine

## 2022-04-09 DIAGNOSIS — Z1231 Encounter for screening mammogram for malignant neoplasm of breast: Secondary | ICD-10-CM

## 2022-04-30 ENCOUNTER — Encounter: Payer: Self-pay | Admitting: Family Medicine

## 2022-04-30 ENCOUNTER — Ambulatory Visit: Payer: BC Managed Care – PPO | Admitting: Family Medicine

## 2022-04-30 VITALS — BP 122/88 | HR 82 | Ht 63.0 in | Wt 156.0 lb

## 2022-04-30 DIAGNOSIS — E7801 Familial hypercholesterolemia: Secondary | ICD-10-CM | POA: Diagnosis not present

## 2022-04-30 DIAGNOSIS — I10 Essential (primary) hypertension: Secondary | ICD-10-CM

## 2022-04-30 MED ORDER — AMLODIPINE BESYLATE 5 MG PO TABS: 5.0000 mg | ORAL_TABLET | Freq: Every day | ORAL | 1 refills | Status: DC

## 2022-04-30 MED ORDER — ATORVASTATIN CALCIUM 10 MG PO TABS: 10.0000 mg | ORAL_TABLET | Freq: Every day | ORAL | 1 refills | Status: DC

## 2022-04-30 MED ORDER — HYDROCHLOROTHIAZIDE 25 MG PO TABS: 25.0000 mg | ORAL_TABLET | Freq: Every day | ORAL | 1 refills | Status: DC

## 2022-04-30 NOTE — Patient Instructions (Signed)

## 2022-04-30 NOTE — Progress Notes (Signed)
Date:  04/30/2022   Name:  Sarah Mcgrath   DOB:  1970-10-23   MRN:  [id number]   Chief Complaint: Hypertension and Hyperlipidemia  Hypertension This is a chronic problem. The current episode started more than 1 year ago. The problem has been gradually improving since onset. The problem is controlled. Pertinent negatives include no blurred vision, headaches, orthopnea, palpitations, PND or shortness of breath. There are no associated agents to hypertension. There are no known risk factors for coronary artery disease. Past treatments include calcium channel blockers, diuretics and lifestyle changes. The current treatment provides moderate improvement. There are no compliance problems.  There is no history of angina, kidney disease, CAD/MI, CVA, heart failure, left ventricular hypertrophy, PVD or retinopathy. There is no history of chronic renal disease, a hypertension causing med or renovascular disease.  Hyperlipidemia She has no history of chronic renal disease. Pertinent negatives include no myalgias or shortness of breath.    Lab Results  Component Value Date   NA 139 10/28/2021   K 4.0 10/28/2021   CO2 24 10/28/2021   GLUCOSE 100 (H) 10/28/2021   BUN 13 10/28/2021   CREATININE 0.80 10/28/2021   CALCIUM 10.1 10/28/2021   EGFR 89 10/28/2021   GFRNONAA 89 06/04/2020   Lab Results  Component Value Date   CHOL 176 10/28/2021   HDL 64 10/28/2021   LDLCALC 98 10/28/2021   TRIG 74 10/28/2021   CHOLHDL 3.9 12/20/2018   Lab Results  Component Value Date   TSH 1.310 01/03/2015   No results found for: "HGBA1C" Lab Results  Component Value Date   WBC 5.6 03/04/2017   HGB 12.8 03/04/2017   HCT 37.6 03/04/2017   MCV 80 03/04/2017   PLT 339 03/04/2017   Lab Results  Component Value Date   ALT 60 (H) 10/28/2021   AST 49 (H) 10/28/2021   ALKPHOS 87 10/28/2021   BILITOT 0.7 10/28/2021   No results found for: "25OHVITD2", "25OHVITD3", "VD25OH"   Review of Systems   Constitutional: Negative.  Negative for chills, fatigue, fever and unexpected weight change.  HENT:  Negative for congestion, ear discharge, ear pain, rhinorrhea, sinus pressure, sneezing and sore throat.   Eyes:  Negative for blurred vision.  Respiratory:  Negative for cough, shortness of breath, wheezing and stridor.   Cardiovascular:  Negative for palpitations, orthopnea and PND.  Gastrointestinal:  Negative for abdominal pain, blood in stool, constipation, diarrhea and nausea.  Genitourinary:  Negative for dysuria, flank pain, frequency, hematuria, urgency and vaginal discharge.  Musculoskeletal:  Negative for arthralgias, back pain and myalgias.  Skin:  Negative for rash.  Neurological:  Negative for dizziness, weakness and headaches.  Hematological:  Negative for adenopathy. Does not bruise/bleed easily.  Psychiatric/Behavioral:  Negative for dysphoric mood. The patient is not nervous/anxious.     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   Essential hypertension 11/06/2014   HLD (hyperlipidemia) 11/06/2014   Drug noncompliance 11/06/2014   Screening for depression 11/06/2014   ASCUS with positive high risk HPV cervical 12/19/2013    No Known Allergies  Past Surgical History:  Procedure Laterality Date   COLONOSCOPY WITH PROPOFOL N/A 07/05/2020   Procedure: COLONOSCOPY WITH PROPOFOL;  Surgeon: Lucilla Lame, MD;  Location: [place];  Service: Endoscopy;  Laterality: N/A;  priority 4   fibroid      Social History   Tobacco Use  Smoking status: Never   Smokeless tobacco: Never  Substance Use Topics   Alcohol use: Yes    Alcohol/week: 0.0 standard drinks of alcohol   Drug use: No     Medication list has been reviewed and updated.  Current Meds  Medication Sig   amLODipine (NORVASC) 5 MG tablet Take 1 tablet (5 mg total) by mouth daily.   atorvastatin  (LIPITOR) 10 MG tablet Take 1 tablet (10 mg total) by mouth daily.   hydrochlorothiazide (HYDRODIURIL) 25 MG tablet Take 1 tablet (25 mg total) by mouth daily.       04/30/2022    9:17 AM 10/28/2021    8:21 AM 04/30/2021    8:05 AM 11/13/2020    8:10 AM  GAD 7 : Generalized Anxiety Score  Nervous, Anxious, on Edge 0 0 0 0  Control/stop worrying 0 0 0 0  Worry too much - different things 0 0 0 0  Trouble relaxing 0 0 0 0  Restless 0 0 0 0  Easily annoyed or irritable 0 0 0 0  Afraid - awful might happen 0 0 0 0  Total GAD 7 Score 0 0 0 0  Anxiety Difficulty Not difficult at all Not difficult at all         04/30/2022    9:17 AM 10/28/2021    8:21 AM 04/30/2021    8:04 AM  Depression screen PHQ 2/9  Decreased Interest 0 0 0  Down, Depressed, Hopeless 0 0 0  PHQ - 2 Score 0 0 0  Altered sleeping 0 0 0  Tired, decreased energy 0 0 0  Change in appetite 0 0 0  Feeling bad or failure about yourself  0 0 0  Trouble concentrating 0 0 0  Moving slowly or fidgety/restless 0 0 0  Suicidal thoughts 0 0 0  PHQ-9 Score 0 0 0  Difficult doing work/chores Not difficult at all Not difficult at all     BP Readings from Last 3 Encounters:  04/30/22 122/88  10/28/21 130/70  04/30/21 120/80    Physical Exam Vitals and nursing note reviewed. Exam conducted with a chaperone present.  Constitutional:      General: She is not in acute distress.    Appearance: She is not diaphoretic.  HENT:     Head: Normocephalic and atraumatic.     Right Ear: Tympanic membrane and external ear normal.     Left Ear: Tympanic membrane and external ear normal.  Neck:     Thyroid: No thyromegaly.     Vascular: No JVD.  Cardiovascular:     Rate and Rhythm: Normal rate and regular rhythm.     Chest Wall: PMI is not displaced. No thrill.     Pulses: Normal pulses.     Heart sounds: Normal heart sounds, S1 normal and S2 normal. No murmur heard.    No systolic murmur is present.     No diastolic murmur is  present.     No friction rub. No gallop. No S3 or S4 sounds.  Pulmonary:     Effort: Pulmonary effort is normal.     Breath sounds: Normal breath sounds. No wheezing, rhonchi or rales.  Abdominal:     General: Bowel sounds are normal.     Palpations: Abdomen is soft. There is no mass.     Tenderness: There is no abdominal tenderness. There is no guarding.  Musculoskeletal:        General: Normal range of motion.  Cervical back: Neck supple.     Right lower leg: No edema.     Left lower leg: No edema.  Lymphadenopathy:     Cervical: No cervical adenopathy.  Skin:    General: Skin is warm and dry.  Neurological:     Mental Status: She is alert.     Wt Readings from Last 3 Encounters:  04/30/22 156 lb (70.8 kg)  10/28/21 158 lb (71.7 kg)  04/30/21 155 lb (70.3 kg)    BP 122/88 (BP Location: Right Arm)   Pulse 82   Ht _0  (1.6 m)   Wt 156 lb (70.8 kg)   SpO2 95%   BMI 27.63 kg/m   Assessment and Plan:  1. Essential hypertension Chronic.  Controlled. Stable.  Blood pressure 120/88.  Asymptomatic.  Reviewed medications well.  Continue amlodipine 5 mg a day and hydrochlorothiazide 25 mg 1 a day.  We will check renal function panel for electrolytes and GFR. - amLODipine (NORVASC) 5 MG tablet; Take 1 tablet (5 mg total) by mouth daily.  Dispense: 90 tablet; Refill: 1 - hydrochlorothiazide (HYDRODIURIL) 25 MG tablet; Take 1 tablet (25 mg total) by mouth daily.  Dispense: 90 tablet; Refill: 1 - Renal Function Panel  2. Familial hypercholesterolemia Chronic.  Controlled.  Stable.  Continue atorvastatin 10 mg once a day.  Review of previous lipid acceptable.  We will recheck in 6 months.  Patient given guidelines for weight loss. - atorvastatin (LIPITOR) 10 MG tablet; Take 1 tablet (10 mg total) by mouth daily.  Dispense: 90 tablet; Refill: 1    Otilio Miu, MD

## 2022-05-01 LAB — RENAL FUNCTION PANEL
Albumin: 4.6 g/dL (ref 3.8–4.9)
BUN/Creatinine Ratio: 14 (ref 9–23)
BUN: 12 mg/dL (ref 6–24)
CO2: 24 mmol/L (ref 20–29)
Calcium: 10.6 mg/dL — ABNORMAL HIGH (ref 8.7–10.2)
Chloride: 101 mmol/L (ref 96–106)
Creatinine, Ser: 0.85 mg/dL (ref 0.57–1.00)
Glucose: 95 mg/dL (ref 70–99)
Phosphorus: 3.6 mg/dL (ref 3.0–4.3)
Potassium: 3.8 mmol/L (ref 3.5–5.2)
Sodium: 141 mmol/L (ref 134–144)
eGFR: 82 mL/min/{1.73_m2} (ref >59)

## 2022-05-14 ENCOUNTER — Ambulatory Visit
Admission: RE | Admit: 2022-05-14 | Discharge: 2022-05-14 | Disposition: A | Discharge status: Home / Self Care | Payer: BC Managed Care – PPO | Source: Ambulatory Visit | Attending: Family Medicine | Admitting: Family Medicine

## 2022-05-14 DIAGNOSIS — Z1231 Encounter for screening mammogram for malignant neoplasm of breast: Secondary | ICD-10-CM | POA: Diagnosis present

## 2022-06-25 ENCOUNTER — Encounter: Payer: Self-pay | Admitting: Family Medicine

## 2022-06-25 ENCOUNTER — Telehealth: Payer: Self-pay | Admitting: Family Medicine

## 2022-06-25 ENCOUNTER — Ambulatory Visit
Admission: RE | Admit: 2022-06-25 | Discharge: 2022-06-25 | Disposition: A | Discharge status: Home / Self Care | Payer: BC Managed Care – PPO | Source: Ambulatory Visit | Attending: Family Medicine | Admitting: Family Medicine

## 2022-06-25 ENCOUNTER — Ambulatory Visit
Admission: RE | Admit: 2022-06-25 | Discharge: 2022-06-25 | Disposition: A | Discharge status: Home / Self Care | Payer: BC Managed Care – PPO | Attending: Family Medicine | Admitting: Family Medicine

## 2022-06-25 ENCOUNTER — Ambulatory Visit: Payer: BC Managed Care – PPO | Admitting: Family Medicine

## 2022-06-25 VITALS — BP 124/78 | HR 64 | Ht 63.0 in | Wt 152.0 lb

## 2022-06-25 DIAGNOSIS — D863 Sarcoidosis of skin: Secondary | ICD-10-CM

## 2022-06-25 NOTE — Telephone Encounter (Signed)
Copied from [phone]. Topic: Appointment Scheduling - Scheduling Inquiry for Clinic >> Jun 25, 2022 10:12 AM Chapman Fitch wrote: Reason for CRM: Pt would like an Xray to check her lungs for Sarcoidosis / she has appt today at 3pm/ please advise if appt is needed for xray

## 2022-06-25 NOTE — Progress Notes (Signed)
Date:  06/25/2022   Name:  Sarah Mcgrath   DOB:  1970-11-15   MRN:  [id number]   Chief Complaint: Sarcoidosis (Needs chest xray)  Patient is a 51 year old female who presents for a comprehensive physical exam. The patient reports the following problems: none. Health maintenance has been reviewed up to date.      Lab Results  Component Value Date   NA 141 04/30/2022   K 3.8 04/30/2022   CO2 24 04/30/2022   GLUCOSE 95 04/30/2022   BUN 12 04/30/2022   CREATININE 0.85 04/30/2022   CALCIUM 10.6 (H) 04/30/2022   EGFR 82 04/30/2022   GFRNONAA 89 06/04/2020   Lab Results  Component Value Date   CHOL 176 10/28/2021   HDL 64 10/28/2021   LDLCALC 98 10/28/2021   TRIG 74 10/28/2021   CHOLHDL 3.9 12/20/2018   Lab Results  Component Value Date   TSH 1.310 01/03/2015   No results found for: "HGBA1C" Lab Results  Component Value Date   WBC 5.6 03/04/2017   HGB 12.8 03/04/2017   HCT 37.6 03/04/2017   MCV 80 03/04/2017   PLT 339 03/04/2017   Lab Results  Component Value Date   ALT 60 (H) 10/28/2021   AST 49 (H) 10/28/2021   ALKPHOS 87 10/28/2021   BILITOT 0.7 10/28/2021   No results found for: "25OHVITD2", "25OHVITD3", "VD25OH"   Review of Systems  Constitutional: Negative.  Negative for chills, fatigue, fever and unexpected weight change.  HENT:  Negative for congestion, ear discharge, ear pain, rhinorrhea, sinus pressure, sneezing and sore throat.   Respiratory:  Negative for cough, chest tightness, shortness of breath, wheezing and stridor.   Gastrointestinal:  Negative for abdominal pain, blood in stool, constipation, diarrhea and nausea.  Genitourinary:  Negative for dysuria, flank pain, frequency, hematuria, urgency and vaginal discharge.  Musculoskeletal:  Negative for arthralgias, back pain and myalgias.  Skin:  Negative for rash.  Neurological:  Negative for dizziness, weakness and headaches.  Hematological:  Negative for adenopathy. Does not  bruise/bleed easily.  Psychiatric/Behavioral:  Negative for dysphoric mood. The patient is not nervous/anxious.     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   Essential hypertension 11/06/2014   HLD (hyperlipidemia) 11/06/2014   Drug noncompliance 11/06/2014   Screening for depression 11/06/2014   ASCUS with positive high risk HPV cervical 12/19/2013    No Known Allergies  Past Surgical History:  Procedure Laterality Date   COLONOSCOPY WITH PROPOFOL N/A 07/05/2020   Procedure: COLONOSCOPY WITH PROPOFOL;  Surgeon: Lucilla Lame, MD;  Location: [place];  Service: Endoscopy;  Laterality: N/A;  priority 4   fibroid      Social History   Tobacco Use   Smoking status: Never   Smokeless tobacco: Never  Substance Use Topics   Alcohol use: Yes    Alcohol/week: 0.0 standard drinks of alcohol   Drug use: No     Medication list has been reviewed and updated.  Current Meds  Medication Sig   amLODipine (NORVASC) 5 MG tablet Take 1 tablet (5 mg total) by mouth daily.   atorvastatin (LIPITOR) 10 MG tablet Take 1 tablet (10 mg total) by mouth daily.   hydrochlorothiazide (HYDRODIURIL) 25 MG tablet Take 1 tablet (25 mg total) by mouth daily.       06/25/2022    3:08 PM 04/30/2022    9:17  AM 10/28/2021    8:21 AM 04/30/2021    8:05 AM  GAD 7 : Generalized Anxiety Score  Nervous, Anxious, on Edge 0 0 0 0  Control/stop worrying 0 0 0 0  Worry too much - different things 0 0 0 0  Trouble relaxing 0 0 0 0  Restless 0 0 0 0  Easily annoyed or irritable 0 0 0 0  Afraid - awful might happen 0 0 0 0  Total GAD 7 Score 0 0 0 0  Anxiety Difficulty Not difficult at all Not difficult at all Not difficult at all        06/25/2022    3:08 PM 04/30/2022    9:17 AM 10/28/2021    8:21 AM  Depression screen PHQ 2/9  Decreased Interest 0 0 0  Down, Depressed, Hopeless 0 0 0  PHQ - 2  Score 0 0 0  Altered sleeping 0 0 0  Tired, decreased energy 0 0 0  Change in appetite 0 0 0  Feeling bad or failure about yourself  0 0 0  Trouble concentrating 0 0 0  Moving slowly or fidgety/restless 0 0 0  Suicidal thoughts 0 0 0  PHQ-9 Score 0 0 0  Difficult doing work/chores Not difficult at all Not difficult at all Not difficult at all    BP Readings from Last 3 Encounters:  06/25/22 124/78  04/30/22 122/88  10/28/21 130/70    Physical Exam Vitals and nursing note reviewed. Exam conducted with a chaperone present.  Constitutional:      General: She is not in acute distress.    Appearance: She is not diaphoretic.  HENT:     Head: Normocephalic and atraumatic.     Right Ear: Tympanic membrane and external ear normal.     Left Ear: Tympanic membrane and external ear normal.     Nose: Nose normal.     Mouth/Throat:     Mouth: Mucous membranes are moist.  Eyes:     General:        Right eye: No discharge.        Left eye: No discharge.     Conjunctiva/sclera: Conjunctivae normal.     Pupils: Pupils are equal, round, and reactive to light.  Neck:     Thyroid: No thyromegaly.     Vascular: No carotid bruit or JVD.  Cardiovascular:     Rate and Rhythm: Normal rate and regular rhythm.     Heart sounds: Normal heart sounds. No murmur heard.    No friction rub. No gallop.  Pulmonary:     Effort: Pulmonary effort is normal.     Breath sounds: Normal breath sounds. No wheezing, rhonchi or rales.  Abdominal:     General: Bowel sounds are normal.     Palpations: Abdomen is soft. There is no mass.     Tenderness: There is no abdominal tenderness. There is no guarding or rebound.  Musculoskeletal:        General: Normal range of motion.     Cervical back: Normal range of motion and neck supple.  Lymphadenopathy:     Cervical: No cervical adenopathy.  Skin:    General: Skin is warm and dry.  Neurological:     Mental Status: She is alert.     Deep Tendon Reflexes:  Reflexes are normal and symmetric.     Wt Readings from Last 3 Encounters:  06/25/22 152 lb (68.9 kg)  04/30/22 156 lb (70.8 kg)  10/28/21 158 lb (71.7 kg)    BP 124/78   Pulse 64   Ht _0  (1.6 m)   Wt 152 lb (68.9 kg)   SpO2 94%   BMI 26.93 kg/m   Assessment and Plan:  1. Cutaneous sarcoidosis Chronic.  Persistent.  Followed by dermatology.  Previously was monitored on every 6 months for chest/pulmonary involvement.  Patient is asymptomatic without cough shortness of breath or reactive airway disease symptoms.  Will refer to to pulmonary if involvement noted by chest radiology. - DG Chest 2 View    Otilio Miu, MD

## 2022-07-15 ENCOUNTER — Other Ambulatory Visit: Payer: Self-pay | Admitting: Family Medicine

## 2022-07-15 DIAGNOSIS — I10 Essential (primary) hypertension: Secondary | ICD-10-CM

## 2022-10-11 ENCOUNTER — Other Ambulatory Visit: Payer: Self-pay | Admitting: Family Medicine

## 2022-10-11 DIAGNOSIS — I10 Essential (primary) hypertension: Secondary | ICD-10-CM

## 2022-10-12 NOTE — Telephone Encounter (Signed)
Requested Prescriptions  Pending Prescriptions Disp Refills   amLODipine (NORVASC) 5 MG tablet [Pharmacy Med Name: AMLODIPINE BESYLATE 5 MG TAB] 90 tablet 0    Sig: TAKE 1 TABLET (5 MG TOTAL) BY MOUTH DAILY.     Cardiovascular: Calcium Channel Blockers 2 Passed - 10/11/2022  8:40 AM      Passed - Last BP in normal range    BP Readings from Last 1 Encounters:  06/25/22 124/78         Passed - Last Heart Rate in normal range    Pulse Readings from Last 1 Encounters:  06/25/22 64         Passed - Valid encounter within last 6 months    Recent Outpatient Visits           3 months ago Cutaneous sarcoidosis   [facility]   5 months ago Essential hypertension   [facility]   11 months ago Essential hypertension   [facility] [facility]   1 year ago Essential hypertension   [facility]   1 year ago Essential hypertension   [place] [facility]       Future Appointments             In 2 weeks Duanne Limerick, MD [facility], [facility]

## 2022-10-29 ENCOUNTER — Ambulatory Visit: Payer: BC Managed Care – PPO | Admitting: Family Medicine

## 2022-10-29 ENCOUNTER — Encounter: Payer: Self-pay | Admitting: Family Medicine

## 2022-10-29 VITALS — BP 120/76 | HR 60 | Ht 63.0 in | Wt 161.0 lb

## 2022-10-29 DIAGNOSIS — I10 Essential (primary) hypertension: Secondary | ICD-10-CM

## 2022-10-29 DIAGNOSIS — E7801 Familial hypercholesterolemia: Secondary | ICD-10-CM

## 2022-10-29 DIAGNOSIS — Z6828 Body mass index (BMI) 28.0-28.9, adult: Secondary | ICD-10-CM | POA: Diagnosis not present

## 2022-10-29 MED ORDER — ATORVASTATIN CALCIUM 10 MG PO TABS: 10.0000 mg | ORAL_TABLET | Freq: Every day | ORAL | 1 refills | Status: DC

## 2022-10-29 MED ORDER — AMLODIPINE BESYLATE 5 MG PO TABS
5.0000 mg | ORAL_TABLET | Freq: Every day | ORAL | 1 refills | Status: DC
Start: 2022-10-29 — End: 2023-04-29

## 2022-10-29 MED ORDER — HYDROCHLOROTHIAZIDE 25 MG PO TABS: 25.0000 mg | ORAL_TABLET | Freq: Every day | ORAL | 1 refills | Status: DC

## 2022-10-29 MED ORDER — AMLODIPINE BESYLATE 5 MG PO TABS: 5.0000 mg | ORAL_TABLET | Freq: Every day | ORAL | 0 refills | Status: DC

## 2022-10-29 NOTE — Progress Notes (Signed)
Date:  10/29/2022   Name:  Sarah Mcgrath   DOB:  September 08, 1970   MRN:  [id number]   Chief Complaint: Hypertension  Hypertension This is a chronic problem. The current episode started more than 1 year ago. The problem has been rapidly improving since onset. The problem is controlled. Pertinent negatives include no blurred vision, chest pain, headaches, orthopnea, palpitations, peripheral edema, PND or shortness of breath. There are no associated agents to hypertension. There are no known risk factors for coronary artery disease. Past treatments include calcium channel blockers and diuretics. The current treatment provides significant improvement. There are no compliance problems.  There is no history of CAD/MI or CVA.  Hyperlipidemia This is a chronic problem. The current episode started more than 1 year ago. The problem is controlled. Recent lipid tests were reviewed and are normal. She has no history of diabetes. There are no known factors aggravating her hyperlipidemia. Pertinent negatives include no chest pain, focal sensory loss, focal weakness, myalgias or shortness of breath. Current antihyperlipidemic treatment includes statins. The current treatment provides mild improvement of lipids.    Lab Results  Component Value Date   NA 141 04/30/2022   K 3.8 04/30/2022   CO2 24 04/30/2022   GLUCOSE 95 04/30/2022   BUN 12 04/30/2022   CREATININE 0.85 04/30/2022   CALCIUM 10.6 (H) 04/30/2022   EGFR 82 04/30/2022   GFRNONAA 89 06/04/2020   Lab Results  Component Value Date   CHOL 176 10/28/2021   HDL 64 10/28/2021   LDLCALC 98 10/28/2021   TRIG 74 10/28/2021   CHOLHDL 3.9 12/20/2018   Lab Results  Component Value Date   TSH 1.310 01/03/2015   No results found for: "HGBA1C" Lab Results  Component Value Date   WBC 5.6 03/04/2017   HGB 12.8 03/04/2017   HCT 37.6 03/04/2017   MCV 80 03/04/2017   PLT 339 03/04/2017   Lab Results  Component Value Date   ALT 60 (H)  10/28/2021   AST 49 (H) 10/28/2021   ALKPHOS 87 10/28/2021   BILITOT 0.7 10/28/2021   No results found for: "25OHVITD2", "25OHVITD3", "VD25OH"   Review of Systems  HENT:  Negative for trouble swallowing.   Eyes:  Negative for blurred vision and visual disturbance.  Respiratory:  Negative for cough, chest tightness, shortness of breath and wheezing.   Cardiovascular:  Positive for leg swelling. Negative for chest pain, palpitations, orthopnea and PND.  Gastrointestinal:  Negative for abdominal pain, blood in stool and constipation.  Endocrine: Negative for polydipsia and polyuria.  Genitourinary:  Negative for difficulty urinating, menstrual problem and vaginal bleeding.  Musculoskeletal:  Negative for arthralgias and myalgias.  Neurological:  Negative for focal weakness and headaches.    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   Essential hypertension 11/06/2014   HLD (hyperlipidemia) 11/06/2014   Drug noncompliance 11/06/2014   Screening for depression 11/06/2014   ASCUS with positive high risk HPV cervical 12/19/2013    No Known Allergies  Past Surgical History:  Procedure Laterality Date   COLONOSCOPY WITH PROPOFOL N/A 07/05/2020   Procedure: COLONOSCOPY WITH PROPOFOL;  Surgeon: Midge Minium, MD;  Location: [facility];  Service: Endoscopy;  Laterality: N/A;  priority 4   fibroid      Social History   Tobacco Use   Smoking status: Never   Smokeless tobacco: Never  Substance Use Topics  Alcohol use: Yes    Alcohol/week: 0.0 standard drinks of alcohol   Drug use: No     Medication list has been reviewed and updated.  Current Meds  Medication Sig   amLODipine (NORVASC) 5 MG tablet TAKE 1 TABLET (5 MG TOTAL) BY MOUTH DAILY.   atorvastatin (LIPITOR) 10 MG tablet Take 1 tablet (10 mg total) by mouth daily.   hydrochlorothiazide (HYDRODIURIL) 25 MG tablet Take  1 tablet (25 mg total) by mouth daily.       10/29/2022    8:57 AM 06/25/2022    3:08 PM 04/30/2022    9:17 AM 10/28/2021    8:21 AM  GAD 7 : Generalized Anxiety Score  Nervous, Anxious, on Edge 0 0 0 0  Control/stop worrying 0 0 0 0  Worry too much - different things 0 0 0 0  Trouble relaxing 0 0 0 0  Restless 0 0 0 0  Easily annoyed or irritable 0 0 0 0  Afraid - awful might happen 0 0 0 0  Total GAD 7 Score 0 0 0 0  Anxiety Difficulty Not difficult at all Not difficult at all Not difficult at all Not difficult at all       10/29/2022    8:56 AM 06/25/2022    3:08 PM 04/30/2022    9:17 AM  Depression screen PHQ 2/9  Decreased Interest 0 0 0  Down, Depressed, Hopeless 0 0 0  PHQ - 2 Score 0 0 0  Altered sleeping 0 0 0  Tired, decreased energy 0 0 0  Change in appetite 0 0 0  Feeling bad or failure about yourself  0 0 0  Trouble concentrating 0 0 0  Moving slowly or fidgety/restless 0 0 0  Suicidal thoughts 0 0 0  PHQ-9 Score 0 0 0  Difficult doing work/chores Not difficult at all Not difficult at all Not difficult at all    BP Readings from Last 3 Encounters:  10/29/22 120/76  06/25/22 124/78  04/30/22 122/88    Physical Exam Vitals and nursing note reviewed. Exam conducted with a chaperone present.  Constitutional:      General: She is not in acute distress.    Appearance: She is not diaphoretic.  HENT:     Head: Normocephalic and atraumatic.     Right Ear: Tympanic membrane and external ear normal.     Left Ear: Tympanic membrane and external ear normal.     Nose: Nose normal. No congestion or rhinorrhea.     Mouth/Throat:     Mouth: Mucous membranes are moist.  Eyes:     General:        Right eye: No discharge.        Left eye: No discharge.     Conjunctiva/sclera: Conjunctivae normal.     Pupils: Pupils are equal, round, and reactive to light.  Neck:     Thyroid: No thyromegaly.     Vascular: No JVD.  Cardiovascular:     Rate and Rhythm: Normal rate  and regular rhythm.     Heart sounds: Normal heart sounds. No murmur heard.    No friction rub. No gallop.  Pulmonary:     Effort: Pulmonary effort is normal.     Breath sounds: Normal breath sounds. No wheezing, rhonchi or rales.  Abdominal:     General: Bowel sounds are normal.     Palpations: Abdomen is soft. There is no mass.     Tenderness: There is  no abdominal tenderness. There is no guarding.  Musculoskeletal:        General: Normal range of motion.     Cervical back: Normal range of motion and neck supple.  Lymphadenopathy:     Cervical: No cervical adenopathy.  Skin:    General: Skin is warm and dry.  Neurological:     Mental Status: She is alert.     Deep Tendon Reflexes: Reflexes are normal and symmetric.     Reflex Scores:      Patellar reflexes are 2+ on the right side and 2+ on the left side.    Wt Readings from Last 3 Encounters:  10/29/22 161 lb (73 kg)  06/25/22 152 lb (68.9 kg)  04/30/22 156 lb (70.8 kg)    BP 120/76   Pulse 60   Ht 5\' 3"  (1.6 m)   Wt 161 lb (73 kg)   SpO2 94%   BMI 28.52 kg/m   Assessment and Plan:  1. Essential hypertension Chronic.  Controlled.  Stable.  Blood pressure 120/76.  Asymptomatic.  Tolerating medications well.  We will continue amlodipine 5 mg once a day and hydrochlorothiazide 25 mg once a day.  Patient has had some swelling in the feet but it is more so in the evenings and he contributes this to his sedentary working circumstances.  I have encouraged her to get up and walk more often that prompted although this may be a possible concern of her Norvasc.  Previous renal panel was noted to have a mild elevation of calcium we will repeat her renal panel at this time. - amLODipine (NORVASC) 5 MG tablet; Take 1 tablet (5 mg total) by mouth daily.  Dispense: 90 tablet; Refill: 1 - hydrochlorothiazide (HYDRODIURIL) 25 MG tablet; Take 1 tablet (25 mg total) by mouth daily.  Dispense: 90 tablet; Refill: 1 - Renal function  panel  2. Familial hypercholesterolemia .  Controlled.  Stable.  Continue atorvastatin 10 mg once a day.  Will check lipid panel for current level of LDL. - atorvastatin (LIPITOR) 10 MG tablet; Take 1 tablet (10 mg total) by mouth daily.  Dispense: 90 tablet; Refill: 1 - Lipid Panel With LDL/HDL Ratio  3. BMI 28.0-28.9,adult Health risks of being over weight were discussed and patient was counseled on weight loss options and exercise.  Patient has been given an ideal weight for BMI of 25 which would be about 20 pounds less than what she is currently at.   Elizabeth Sauer, MD

## 2022-10-30 LAB — RENAL FUNCTION PANEL
Albumin: 4.3 g/dL (ref 3.8–4.9)
BUN/Creatinine Ratio: 11 (ref 9–23)
BUN: 9 mg/dL (ref 6–24)
CO2: 25 mmol/L (ref 20–29)
Calcium: 9.9 mg/dL (ref 8.7–10.2)
Chloride: 103 mmol/L (ref 96–106)
Creatinine, Ser: 0.84 mg/dL (ref 0.57–1.00)
Glucose: 93 mg/dL (ref 70–99)
Phosphorus: 3.2 mg/dL (ref 3.0–4.3)
Potassium: 3.7 mmol/L (ref 3.5–5.2)
Sodium: 143 mmol/L (ref 134–144)
eGFR: 84 mL/min/{1.73_m2} (ref >59)

## 2022-10-30 LAB — LIPID PANEL WITH LDL/HDL RATIO
Cholesterol, Total: 176 mg/dL (ref 100–199)
HDL: 80 mg/dL (ref >39)
LDL Chol Calc (NIH): 79 mg/dL (ref 0–99)
LDL/HDL Ratio: 1 ratio (ref 0.0–3.2)
Triglycerides: 92 mg/dL (ref 0–149)
VLDL Cholesterol Cal: 17 mg/dL (ref 5–40)

## 2023-04-19 ENCOUNTER — Other Ambulatory Visit: Payer: Self-pay | Admitting: Family Medicine

## 2023-04-19 DIAGNOSIS — Z1231 Encounter for screening mammogram for malignant neoplasm of breast: Secondary | ICD-10-CM

## 2023-04-29 ENCOUNTER — Ambulatory Visit: Payer: BC Managed Care – PPO | Admitting: Family Medicine

## 2023-04-29 ENCOUNTER — Encounter: Payer: Self-pay | Admitting: Family Medicine

## 2023-04-29 VITALS — BP 110/84 | HR 82 | Ht 63.0 in | Wt 162.0 lb

## 2023-04-29 DIAGNOSIS — E7801 Familial hypercholesterolemia: Secondary | ICD-10-CM

## 2023-04-29 DIAGNOSIS — I10 Essential (primary) hypertension: Secondary | ICD-10-CM | POA: Diagnosis not present

## 2023-04-29 MED ORDER — HYDROCHLOROTHIAZIDE 25 MG PO TABS: 25.0000 mg | ORAL_TABLET | Freq: Every day | ORAL | 1 refills | Status: DC

## 2023-04-29 MED ORDER — AMLODIPINE BESYLATE 5 MG PO TABS: 5.0000 mg | ORAL_TABLET | Freq: Every day | ORAL | 1 refills | Status: DC

## 2023-04-29 MED ORDER — ATORVASTATIN CALCIUM 10 MG PO TABS: 10.0000 mg | ORAL_TABLET | Freq: Every day | ORAL | 1 refills | Status: DC

## 2023-04-29 NOTE — Patient Instructions (Addendum)

## 2023-04-29 NOTE — Progress Notes (Signed)
Date:  04/29/2023   Name:  Sarah Mcgrath   DOB:  1971-05-25   MRN:  [id number]   Chief Complaint: Hypertension and Hyperlipidemia  Hypertension This is a chronic problem. The current episode started more than 1 year ago. The problem has been gradually improving since onset. The problem is controlled. Pertinent negatives include no anxiety, blurred vision, chest pain, headaches, malaise/fatigue, neck pain, orthopnea, palpitations, peripheral edema, PND, shortness of breath or sweats. There are no associated agents to hypertension. Risk factors for coronary artery disease include dyslipidemia. Past treatments include diuretics and calcium channel blockers. The current treatment provides moderate improvement. There are no compliance problems.  There is no history of angina, CAD/MI, CVA or left ventricular hypertrophy. There is no history of chronic renal disease, a hypertension causing med or renovascular disease.  Hyperlipidemia This is a chronic problem. The current episode started more than 1 year ago. Recent lipid tests were reviewed and are normal. She has no history of chronic renal disease. Pertinent negatives include no chest pain, myalgias or shortness of breath. Current antihyperlipidemic treatment includes statins. The current treatment provides moderate improvement of lipids. There are no compliance problems.     Lab Results  Component Value Date   NA 143 10/29/2022   K 3.7 10/29/2022   CO2 25 10/29/2022   GLUCOSE 93 10/29/2022   BUN 9 10/29/2022   CREATININE 0.84 10/29/2022   CALCIUM 9.9 10/29/2022   EGFR 84 10/29/2022   GFRNONAA 89 06/04/2020   Lab Results  Component Value Date   CHOL 176 10/29/2022   HDL 80 10/29/2022   LDLCALC 79 10/29/2022   TRIG 92 10/29/2022   CHOLHDL 3.9 12/20/2018   Lab Results  Component Value Date   TSH 1.310 01/03/2015   No results found for: "HGBA1C" Lab Results  Component Value Date   WBC 5.6 03/04/2017   HGB 12.8  03/04/2017   HCT 37.6 03/04/2017   MCV 80 03/04/2017   PLT 339 03/04/2017   Lab Results  Component Value Date   ALT 60 (H) 10/28/2021   AST 49 (H) 10/28/2021   ALKPHOS 87 10/28/2021   BILITOT 0.7 10/28/2021   No results found for: "25OHVITD2", "25OHVITD3", "VD25OH"   Review of Systems  Constitutional:  Negative for fatigue, malaise/fatigue and unexpected weight change.  HENT:  Negative for trouble swallowing.   Eyes:  Negative for blurred vision and visual disturbance.  Respiratory:  Negative for cough, chest tightness, shortness of breath and wheezing.   Cardiovascular:  Negative for chest pain, palpitations, orthopnea, leg swelling and PND.  Gastrointestinal:  Negative for abdominal pain and blood in stool.  Endocrine: Negative for polydipsia and polyuria.  Genitourinary:  Negative for vaginal bleeding.  Musculoskeletal:  Negative for arthralgias, myalgias and neck pain.  Neurological:  Negative for headaches.    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 [place] [facility];  Service: Endoscopy;  Laterality: N/A;  priority 4   fibroid      Social History   Tobacco  Use   Smoking status: Never   Smokeless tobacco: Never  Substance Use Topics   Alcohol use: Yes    Alcohol/week: 0.0 standard drinks of alcohol   Drug use: No     Medication list has been reviewed and updated.  Current Meds  Medication Sig   amLODipine (NORVASC) 5 MG tablet Take 1 tablet (5 mg total) by mouth daily.    atorvastatin (LIPITOR) 10 MG tablet Take 1 tablet (10 mg total) by mouth daily.   hydrochlorothiazide (HYDRODIURIL) 25 MG tablet Take 1 tablet (25 mg total) by mouth daily.       04/29/2023    8:58 AM 10/29/2022    8:57 AM 06/25/2022    3:08 PM 04/30/2022    9:17 AM  GAD 7 : Generalized Anxiety Score  Nervous, Anxious, on Edge 0 0 0 0  Control/stop worrying 0 0 0 0  Worry too much - different things 0 0 0 0  Trouble relaxing 0 0 0 0  Restless 0 0 0 0  Easily annoyed or irritable 0 0 0 0  Afraid - awful might happen 0 0 0 0  Total GAD 7 Score 0 0 0 0  Anxiety Difficulty Not difficult at all Not difficult at all Not difficult at all Not difficult at all       04/29/2023    8:57 AM 10/29/2022    8:56 AM 06/25/2022    3:08 PM  Depression screen PHQ 2/9  Decreased Interest 0 0 0  Down, Depressed, Hopeless 0 0 0  PHQ - 2 Score 0 0 0  Altered sleeping 0 0 0  Tired, decreased energy 0 0 0  Change in appetite 0 0 0  Feeling bad or failure about yourself  0 0 0  Trouble concentrating 0 0 0  Moving slowly or fidgety/restless 0 0 0  Suicidal thoughts 0 0 0  PHQ-9 Score 0 0 0  Difficult doing work/chores Not difficult at all Not difficult at all Not difficult at all    BP Readings from Last 3 Encounters:  04/29/23 110/84  10/29/22 120/76  06/25/22 124/78    Physical Exam Vitals and nursing note reviewed. Exam conducted with a chaperone present.  Constitutional:      General: She is not in acute distress.    Appearance: She is not diaphoretic.  HENT:     Head: Normocephalic and atraumatic.     Right Ear: External ear normal.     Left Ear: External ear normal.     Nose: Nose normal.     Mouth/Throat:     Mouth: Mucous membranes are moist.  Eyes:     General:        Right eye: No discharge.        Left eye: No discharge.     Conjunctiva/sclera: Conjunctivae normal.     Pupils: Pupils are equal, round, and reactive to light.  Neck:     Thyroid: No thyromegaly.      Vascular: No JVD.  Cardiovascular:     Rate and Rhythm: Normal rate and regular rhythm.     Heart sounds: Normal heart sounds. No murmur heard.    No friction rub. No gallop.  Pulmonary:     Effort: Pulmonary effort is normal.     Breath sounds: Normal breath sounds. No wheezing, rhonchi or rales.  Abdominal:     General: Bowel sounds are normal.     Palpations: Abdomen is soft. There is no mass.  Tenderness: There is no abdominal tenderness. There is no guarding.  Musculoskeletal:        General: Normal range of motion.     Cervical back: Normal range of motion and neck supple.  Lymphadenopathy:     Cervical: No cervical adenopathy.  Skin:    General: Skin is warm and dry.  Neurological:     Mental Status: She is alert.     Deep Tendon Reflexes: Reflexes are normal and symmetric.     Reflex Scores:      Patellar reflexes are 2+ on the right side and 2+ on the left side.    Wt Readings from Last 3 Encounters:  04/29/23 162 lb (73.5 kg)  10/29/22 161 lb (73 kg)  06/25/22 152 lb (68.9 kg)    BP 110/84   Pulse 82   Ht 5\' 3"  (1.6 m)   Wt 162 lb (73.5 kg)   SpO2 97%   BMI 28.70 kg/m   Assessment and Plan: 1. Essential hypertension Chronic.  Controlled.  Stable.  Blood pressure today is 110/84.  Asymptomatic.  Tolerating medication well.  Continue current dosings of hydrochlorothiazide 25 mg once a day and amlodipine 5 mg once a day.  Will check renal function panel for electrolytes and GFR.  Will recheck patient in 6 months. - hydrochlorothiazide (HYDRODIURIL) 25 MG tablet; Take 1 tablet (25 mg total) by mouth daily.  Dispense: 90 tablet; Refill: 1 - amLODipine (NORVASC) 5 MG tablet; Take 1 tablet (5 mg total) by mouth daily.  Dispense: 90 tablet; Refill: 1 - Renal Function Panel  2. Familial hypercholesterolemia Chronic.  Controlled.  Stable.  Asymptomatic.  Tolerating medications without myalgias.  Will continue atorvastatin 10 mg once a day.  We will check lipid  panel for current level of LDL control and adjust accordingly.  Patient has been given low-cholesterol low triglyceride dietary guidelines and we have reinforced suggested instructions for lowering cholesterol and triglycerides.  Patient will be rechecked in 6 months. - atorvastatin (LIPITOR) 10 MG tablet; Take 1 tablet (10 mg total) by mouth daily.  Dispense: 90 tablet; Refill: 1 - Lipid Panel With LDL/HDL Ratio     Elizabeth Sauer, MD

## 2023-04-30 ENCOUNTER — Encounter: Payer: Self-pay | Admitting: Family Medicine

## 2023-04-30 LAB — RENAL FUNCTION PANEL
Albumin: 4.7 g/dL (ref 3.8–4.9)
BUN/Creatinine Ratio: 18 (ref 9–23)
BUN: 15 mg/dL (ref 6–24)
CO2: 24 mmol/L (ref 20–29)
Calcium: 10.5 mg/dL — ABNORMAL HIGH (ref 8.7–10.2)
Chloride: 103 mmol/L (ref 96–106)
Creatinine, Ser: 0.83 mg/dL (ref 0.57–1.00)
Glucose: 97 mg/dL (ref 70–99)
Phosphorus: 3.3 mg/dL (ref 3.0–4.3)
Potassium: 3.7 mmol/L (ref 3.5–5.2)
Sodium: 143 mmol/L (ref 134–144)
eGFR: 84 mL/min/{1.73_m2} (ref >59)

## 2023-04-30 LAB — LIPID PANEL WITH LDL/HDL RATIO
Cholesterol, Total: 207 mg/dL — ABNORMAL HIGH (ref 100–199)
HDL: 94 mg/dL (ref >39)
LDL Chol Calc (NIH): 99 mg/dL (ref 0–99)
LDL/HDL Ratio: 1.1 ratio (ref 0.0–3.2)
Triglycerides: 78 mg/dL (ref 0–149)
VLDL Cholesterol Cal: 14 mg/dL (ref 5–40)

## 2023-05-17 ENCOUNTER — Ambulatory Visit
Admission: RE | Admit: 2023-05-17 | Discharge: 2023-05-17 | Disposition: A | Discharge status: Home / Self Care | Payer: BC Managed Care – PPO | Source: Ambulatory Visit | Attending: Family Medicine | Admitting: Family Medicine

## 2023-05-17 DIAGNOSIS — Z1231 Encounter for screening mammogram for malignant neoplasm of breast: Secondary | ICD-10-CM | POA: Diagnosis present

## 2023-10-05 ENCOUNTER — Encounter: Payer: Self-pay | Admitting: Family Medicine

## 2023-10-05 ENCOUNTER — Ambulatory Visit: Payer: PRIVATE HEALTH INSURANCE | Admitting: Family Medicine

## 2023-10-05 VITALS — BP 120/88 | HR 61 | Ht 63.0 in | Wt 169.0 lb

## 2023-10-05 DIAGNOSIS — I10 Essential (primary) hypertension: Secondary | ICD-10-CM | POA: Diagnosis not present

## 2023-10-05 DIAGNOSIS — E7801 Familial hypercholesterolemia: Secondary | ICD-10-CM | POA: Diagnosis not present

## 2023-10-05 MED ORDER — ATORVASTATIN CALCIUM 10 MG PO TABS: 10.0000 mg | ORAL_TABLET | Freq: Every day | ORAL | 1 refills | Status: AC

## 2023-10-05 MED ORDER — HYDROCHLOROTHIAZIDE 25 MG PO TABS: 25.0000 mg | ORAL_TABLET | Freq: Every day | ORAL | 1 refills | Status: AC

## 2023-10-05 MED ORDER — AMLODIPINE BESYLATE 5 MG PO TABS: 5.0000 mg | ORAL_TABLET | Freq: Every day | ORAL | 1 refills | Status: AC

## 2023-10-05 NOTE — Progress Notes (Signed)
 Date:  10/05/2023   Name:  Sarah Mcgrath   DOB:  [birth year]   MRN:  [id number]   Chief Complaint: Hypertension and Hyperlipidemia  Hypertension This is a chronic problem. The current episode started more than 1 year ago. The problem has been gradually improving since onset. The problem is controlled. Pertinent negatives include no anxiety, blurred vision, chest pain, headaches, malaise/fatigue, neck pain, orthopnea, palpitations, peripheral edema, PND, shortness of breath or sweats. There are no associated agents to hypertension. Risk factors for coronary artery disease include dyslipidemia. Past treatments include calcium channel blockers. The current treatment provides moderate improvement. There are no compliance problems.  There is no history of CAD/MI or CVA. There is no history of chronic renal disease, a hypertension causing med or renovascular disease.  Hyperlipidemia This is a chronic problem. The current episode started more than 1 year ago. The problem is controlled. Recent lipid tests were reviewed and are normal. She has no history of chronic renal disease, diabetes, hypothyroidism or obesity. Pertinent negatives include no chest pain or shortness of breath. Current antihyperlipidemic treatment includes statins. The current treatment provides moderate improvement of lipids. There are no compliance problems.  Risk factors for coronary artery disease include dyslipidemia and hypertension.    Lab Results  Component Value Date   NA 143 04/29/2023   K 3.7 04/29/2023   CO2 24 04/29/2023   GLUCOSE 97 04/29/2023   BUN 15 04/29/2023   CREATININE 0.83 04/29/2023   CALCIUM 10.5 (H) 04/29/2023   EGFR 84 04/29/2023   GFRNONAA 89 06/04/2020   Lab Results  Component Value Date   CHOL 207 (H) 04/29/2023   HDL 94 04/29/2023   LDLCALC 99 04/29/2023   TRIG 78 04/29/2023   CHOLHDL 3.9 12/20/2018   Lab Results  Component Value Date   TSH 1.310 01/03/2015   No results found  for: "HGBA1C" Lab Results  Component Value Date   WBC 5.6 03/04/2017   HGB 12.8 03/04/2017   HCT 37.6 03/04/2017   MCV 80 03/04/2017   PLT 339 03/04/2017   Lab Results  Component Value Date   ALT 60 (H) 10/28/2021   AST 49 (H) 10/28/2021   ALKPHOS 87 10/28/2021   BILITOT 0.7 10/28/2021   No results found for: "25OHVITD2", "25OHVITD3", "VD25OH"   Review of Systems  Constitutional:  Negative for malaise/fatigue.  Eyes:  Negative for blurred vision and visual disturbance.  Respiratory:  Negative for cough, choking, chest tightness, shortness of breath and wheezing.   Cardiovascular:  Negative for chest pain, palpitations, orthopnea, leg swelling and PND.  Gastrointestinal:  Negative for abdominal pain and blood in stool.  Musculoskeletal:  Negative for neck pain.  Neurological:  Negative for headaches.    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   Essential hypertension 11/06/2014   HLD (hyperlipidemia) 11/06/2014   Drug noncompliance 11/06/2014   Screening for depression 11/06/2014   ASCUS with positive high risk HPV cervical 12/19/2013    No Known Allergies  Past Surgical History:  Procedure Laterality Date   COLONOSCOPY WITH PROPOFOL N/A 07/05/2020   Procedure: COLONOSCOPY WITH PROPOFOL;  Surgeon: Midge Minium, MD;  Location: [facility];  Service: Endoscopy;  Laterality: N/A;  priority 4   fibroid      Social History   Tobacco Use   Smoking status: Never   Smokeless tobacco: Never  Substance Use Topics  Alcohol use: Yes    Alcohol/week: 0.0 standard drinks of alcohol   Drug use: No     Medication list has been reviewed and updated.  Current Meds  Medication Sig   amLODipine (NORVASC) 5 MG tablet Take 1 tablet (5 mg total) by mouth daily.   atorvastatin (LIPITOR) 10 MG tablet Take 1 tablet (10 mg total) by mouth daily.   hydrochlorothiazide  (HYDRODIURIL) 25 MG tablet Take 1 tablet (25 mg total) by mouth daily.   hydroxychloroquine (PLAQUENIL) 200 MG tablet Take by mouth.       10/05/2023    8:16 AM 04/29/2023    8:58 AM 10/29/2022    8:57 AM 06/25/2022    3:08 PM  GAD 7 : Generalized Anxiety Score  Nervous, Anxious, on Edge 0 0 0 0  Control/stop worrying 0 0 0 0  Worry too much - different things 0 0 0 0  Trouble relaxing 0 0 0 0  Restless 0 0 0 0  Easily annoyed or irritable 0 0 0 0  Afraid - awful might happen 0 0 0 0  Total GAD 7 Score 0 0 0 0  Anxiety Difficulty Not difficult at all Not difficult at all Not difficult at all Not difficult at all       10/05/2023    8:16 AM 04/29/2023    8:57 AM 10/29/2022    8:56 AM  Depression screen PHQ 2/9  Decreased Interest 0 0 0  Down, Depressed, Hopeless 0 0 0  PHQ - 2 Score 0 0 0  Altered sleeping  0 0  Tired, decreased energy  0 0  Change in appetite  0 0  Feeling bad or failure about yourself   0 0  Trouble concentrating  0 0  Moving slowly or fidgety/restless  0 0  Suicidal thoughts  0 0  PHQ-9 Score  0 0  Difficult doing work/chores  Not difficult at all Not difficult at all    BP Readings from Last 3 Encounters:  10/05/23 120/88  04/29/23 110/84  10/29/22 120/76    Physical Exam Vitals and nursing note reviewed.  Constitutional:      General: She is not in acute distress.    Appearance: She is not diaphoretic.  HENT:     Head: Normocephalic and atraumatic.     Right Ear: Tympanic membrane, ear canal and external ear normal.     Left Ear: Tympanic membrane, ear canal and external ear normal.     Nose: Nose normal.     Mouth/Throat:     Mouth: Mucous membranes are moist.  Eyes:     General:        Right eye: No discharge.        Left eye: No discharge.     Conjunctiva/sclera: Conjunctivae normal.     Pupils: Pupils are equal, round, and reactive to light.  Neck:     Thyroid: No thyromegaly.     Vascular: No JVD.  Cardiovascular:     Rate and  Rhythm: Normal rate and regular rhythm.     Heart sounds: Normal heart sounds. No murmur heard.    No friction rub. No gallop.  Pulmonary:     Effort: Pulmonary effort is normal.     Breath sounds: Normal breath sounds. No wheezing, rhonchi or rales.  Abdominal:     General: Bowel sounds are normal.     Palpations: Abdomen is soft. There is no mass.     Tenderness: There  is no abdominal tenderness. There is no guarding.  Musculoskeletal:        General: Normal range of motion.     Cervical back: Normal range of motion and neck supple.  Lymphadenopathy:     Cervical: No cervical adenopathy.  Skin:    General: Skin is warm and dry.  Neurological:     Mental Status: She is alert.     Deep Tendon Reflexes: Reflexes are normal and symmetric.     Wt Readings from Last 3 Encounters:  10/05/23 169 lb (76.7 kg)  04/29/23 162 lb (73.5 kg)  10/29/22 161 lb (73 kg)    BP 120/88   Pulse 61   Ht 5\' 3"  (1.6 m)   Wt 169 lb (76.7 kg)   SpO2 99%   BMI 29.94 kg/m   Assessment and Plan:  1. Essential hypertension (Primary) Chronic.  Controlled.  Stable.  Asymptomatic.  Tolerating medication well.  Blood pressure today is 120/88.  Asymptomatic.  Tolerating medication well.  Will continue amlodipine 5 mg once a day and hydrochlorothiazide 25 mg once a day.  Will check CMP for electrolytes and GFR.  Will recheck patient in 6 months. - amLODipine (NORVASC) 5 MG tablet; Take 1 tablet (5 mg total) by mouth daily.  Dispense: 90 tablet; Refill: 1 - hydrochlorothiazide (HYDRODIURIL) 25 MG tablet; Take 1 tablet (25 mg total) by mouth daily.  Dispense: 90 tablet; Refill: 1 - Comprehensive metabolic panel with GFR  2. Familial hypercholesterolemia Chronic.  Controlled.  Stable.  Asymptomatic.  Tolerating atorvastatin 10 mg once a day.  Will continue at current dosing.  Patient denies any myalgias or muscle weakness.  Will check lipid panel for current level of LDL control and CMP for hepatic concerns.   Will recheck patient in 6 months. - atorvastatin (LIPITOR) 10 MG tablet; Take 1 tablet (10 mg total) by mouth daily.  Dispense: 90 tablet; Refill: 1 - Comprehensive metabolic panel with GFR - Lipid Panel With LDL/HDL Ratio    Elizabeth Sauer, MD

## 2023-10-06 ENCOUNTER — Encounter: Payer: Self-pay | Admitting: Family Medicine

## 2023-10-06 LAB — COMPREHENSIVE METABOLIC PANEL WITH GFR
ALT: 30 IU/L (ref 0–32)
AST: 24 IU/L (ref 0–40)
Albumin: 4.4 g/dL (ref 3.8–4.9)
Alkaline Phosphatase: 67 IU/L (ref 44–121)
BUN/Creatinine Ratio: 14 (ref 9–23)
BUN: 12 mg/dL (ref 6–24)
Bilirubin Total: 0.6 mg/dL (ref 0.0–1.2)
CO2: 26 mmol/L (ref 20–29)
Calcium: 10.4 mg/dL — ABNORMAL HIGH (ref 8.7–10.2)
Chloride: 101 mmol/L (ref 96–106)
Creatinine, Ser: 0.87 mg/dL (ref 0.57–1.00)
Globulin, Total: 2.5 g/dL (ref 1.5–4.5)
Glucose: 88 mg/dL (ref 70–99)
Potassium: 3.7 mmol/L (ref 3.5–5.2)
Sodium: 141 mmol/L (ref 134–144)
Total Protein: 6.9 g/dL (ref 6.0–8.5)
eGFR: 80 mL/min/{1.73_m2} (ref >59)

## 2023-10-06 LAB — LIPID PANEL WITH LDL/HDL RATIO
Cholesterol, Total: 169 mg/dL (ref 100–199)
HDL: 75 mg/dL (ref >39)
LDL Chol Calc (NIH): 76 mg/dL (ref 0–99)
LDL/HDL Ratio: 1 ratio (ref 0.0–3.2)
Triglycerides: 98 mg/dL (ref 0–149)
VLDL Cholesterol Cal: 18 mg/dL (ref 5–40)

## 2024-03-29 ENCOUNTER — Encounter: Admitting: Student

## 2024-05-03 ENCOUNTER — Other Ambulatory Visit: Payer: Self-pay | Admitting: Internal Medicine

## 2024-05-03 DIAGNOSIS — Z1231 Encounter for screening mammogram for malignant neoplasm of breast: Secondary | ICD-10-CM

## 2024-05-18 ENCOUNTER — Ambulatory Visit
Admission: RE | Admit: 2024-05-18 | Discharge: 2024-05-18 | Disposition: A | Discharge status: Home / Self Care | Source: Ambulatory Visit | Attending: Internal Medicine | Admitting: Internal Medicine

## 2024-05-18 DIAGNOSIS — Z1231 Encounter for screening mammogram for malignant neoplasm of breast: Secondary | ICD-10-CM | POA: Insufficient documentation

## 2024-06-08 ENCOUNTER — Encounter
